# Patient Record
Sex: Female | Born: 1985 | Race: Black or African American | Hispanic: No | Marital: Single | Smoking: Current every day smoker
Health system: Southern US, Community
[De-identification: ages and names within clinical notes are randomized; demographics above are authoritative.]

## PROBLEM LIST (undated history)

## (undated) DIAGNOSIS — I10 Essential (primary) hypertension: Secondary | ICD-10-CM

## (undated) DIAGNOSIS — R569 Unspecified convulsions: Secondary | ICD-10-CM

## (undated) DIAGNOSIS — D496 Neoplasm of unspecified behavior of brain: Secondary | ICD-10-CM

---
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
## 2004-01-04 ENCOUNTER — Emergency Department (HOSPITAL_COMMUNITY): Admission: EM | Admit: 2004-01-04 | Discharge: 2004-01-04 | Payer: Self-pay | Admitting: Emergency Medicine

## 2005-10-28 ENCOUNTER — Emergency Department (HOSPITAL_COMMUNITY): Admission: EM | Admit: 2005-10-28 | Discharge: 2005-10-28 | Payer: Self-pay | Admitting: Emergency Medicine

## 2006-09-09 ENCOUNTER — Emergency Department (HOSPITAL_COMMUNITY): Admission: EM | Admit: 2006-09-09 | Discharge: 2006-09-09 | Payer: Self-pay | Admitting: Emergency Medicine

## 2007-05-27 ENCOUNTER — Emergency Department (HOSPITAL_COMMUNITY): Admission: EM | Admit: 2007-05-27 | Discharge: 2007-05-27 | Payer: Self-pay | Admitting: Emergency Medicine

## 2009-04-06 ENCOUNTER — Emergency Department (HOSPITAL_COMMUNITY): Admission: EM | Admit: 2009-04-06 | Discharge: 2009-04-06 | Payer: Self-pay | Admitting: Emergency Medicine

## 2009-12-21 ENCOUNTER — Emergency Department (HOSPITAL_COMMUNITY): Admission: EM | Admit: 2009-12-21 | Discharge: 2009-12-21 | Payer: Self-pay | Admitting: Emergency Medicine

## 2010-01-02 ENCOUNTER — Emergency Department (HOSPITAL_COMMUNITY): Admission: EM | Admit: 2010-01-02 | Discharge: 2010-01-02 | Payer: Self-pay | Admitting: Emergency Medicine

## 2010-12-31 LAB — LIPASE, BLOOD: Lipase: 19 U/L (ref 11–59)

## 2010-12-31 LAB — COMPREHENSIVE METABOLIC PANEL
ALT: 17 U/L (ref 0–35)
Albumin: 4.3 g/dL (ref 3.5–5.2)
BUN: 9 mg/dL (ref 6–23)
CO2: 20 mEq/L (ref 19–32)
Creatinine, Ser: 0.81 mg/dL (ref 0.4–1.2)
Potassium: 3.7 mEq/L (ref 3.5–5.1)
Total Bilirubin: 0.6 mg/dL (ref 0.3–1.2)
Total Protein: 7.9 g/dL (ref 6.0–8.3)

## 2010-12-31 LAB — URINALYSIS, ROUTINE W REFLEX MICROSCOPIC
Bilirubin Urine: NEGATIVE
Ketones, ur: 15 mg/dL — AB
Specific Gravity, Urine: 1.015 (ref 1.005–1.030)
pH: 6 (ref 5.0–8.0)

## 2010-12-31 LAB — URINE MICROSCOPIC-ADD ON

## 2010-12-31 LAB — DIFFERENTIAL: Neutrophils Relative %: 73 % (ref 43–77)

## 2010-12-31 LAB — CBC
Hemoglobin: 14.9 g/dL (ref 12.0–15.0)
MCHC: 33.2 g/dL (ref 30.0–36.0)
MCV: 83.5 fL (ref 78.0–100.0)
Platelets: 269 10*3/uL (ref 150–400)
RBC: 5.36 MIL/uL — ABNORMAL HIGH (ref 3.87–5.11)
WBC: 7.3 10*3/uL (ref 4.0–10.5)

## 2011-07-05 LAB — COMPREHENSIVE METABOLIC PANEL
BUN: 9
CO2: 24
Creatinine, Ser: 0.62
GFR calc Af Amer: >60
GFR calc non Af Amer: >60
Glucose, Bld: 90
Sodium: 136
Total Bilirubin: 0.6

## 2011-07-05 LAB — CBC
HCT: 37.8
Hemoglobin: 12.8
MCV: 79.9
Platelets: 300
RBC: 4.74
RDW: 13.9

## 2011-07-05 LAB — URINALYSIS, ROUTINE W REFLEX MICROSCOPIC
Bilirubin Urine: NEGATIVE
Glucose, UA: NEGATIVE
Specific Gravity, Urine: 1.02
Urobilinogen, UA: 1

## 2011-07-05 LAB — DIFFERENTIAL
Basophils Relative: 0
Eosinophils Absolute: 0.1
Eosinophils Relative: 1
Lymphocytes Relative: 27
Monocytes Relative: 9
Neutro Abs: 5.6

## 2011-07-05 LAB — URINE MICROSCOPIC-ADD ON

## 2011-07-05 LAB — LIPASE, BLOOD: Lipase: 15

## 2011-07-05 LAB — POCT PREGNANCY, URINE
Operator id: 28741
Preg Test, Ur: NEGATIVE

## 2015-08-08 ENCOUNTER — Emergency Department (HOSPITAL_COMMUNITY)
Admission: EM | Admit: 2015-08-08 | Discharge: 2015-08-08 | Disposition: A | Discharge status: Home / Self Care | Payer: Self-pay | Attending: Emergency Medicine | Admitting: Emergency Medicine

## 2015-08-08 ENCOUNTER — Encounter (HOSPITAL_COMMUNITY): Payer: Self-pay | Admitting: Emergency Medicine

## 2015-08-08 DIAGNOSIS — Z3202 Encounter for pregnancy test, result negative: Secondary | ICD-10-CM | POA: Insufficient documentation

## 2015-08-08 DIAGNOSIS — N939 Abnormal uterine and vaginal bleeding, unspecified: Secondary | ICD-10-CM | POA: Insufficient documentation

## 2015-08-08 DIAGNOSIS — F17200 Nicotine dependence, unspecified, uncomplicated: Secondary | ICD-10-CM | POA: Insufficient documentation

## 2015-08-08 DIAGNOSIS — B9689 Other specified bacterial agents as the cause of diseases classified elsewhere: Secondary | ICD-10-CM

## 2015-08-08 DIAGNOSIS — N760 Acute vaginitis: Secondary | ICD-10-CM | POA: Insufficient documentation

## 2015-08-08 LAB — WET PREP, GENITAL
SPERM: NONE SEEN
TRICH WET PREP: NONE SEEN
YEAST WET PREP: NONE SEEN

## 2015-08-08 LAB — CBC WITH DIFFERENTIAL/PLATELET
BASOS ABS: 0 10*3/uL (ref 0.0–0.1)
Basophils Relative: 0 %
EOS PCT: 2 %
Eosinophils Absolute: 0.2 10*3/uL (ref 0.0–0.7)
HCT: 39.8 % (ref 36.0–46.0)
HEMOGLOBIN: 13.4 g/dL (ref 12.0–15.0)
LYMPHS ABS: 3.6 10*3/uL (ref 0.7–4.0)
LYMPHS PCT: 42 %
MCH: 27.5 pg (ref 26.0–34.0)
MCHC: 33.7 g/dL (ref 30.0–36.0)
MCV: 81.7 fL (ref 78.0–100.0)
Monocytes Absolute: 0.8 10*3/uL (ref 0.1–1.0)
Monocytes Relative: 9 %
NEUTROS ABS: 4.1 10*3/uL (ref 1.7–7.7)
Neutrophils Relative %: 47 %
PLATELETS: 253 10*3/uL (ref 150–400)
RBC: 4.87 MIL/uL (ref 3.87–5.11)
RDW: 14.3 % (ref 11.5–15.5)
WBC: 8.7 10*3/uL (ref 4.0–10.5)

## 2015-08-08 LAB — I-STAT BETA HCG BLOOD, ED (MC, WL, AP ONLY): I-stat hCG, quantitative: <5 m[IU]/mL (ref <5)

## 2015-08-08 MED ORDER — NAPROXEN 500 MG PO TABS: 500.0000 mg | ORAL_TABLET | Freq: Two times a day (BID) | ORAL | Status: DC

## 2015-08-08 MED ORDER — LIDOCAINE HCL (PF) 1 % IJ SOLN
INTRAMUSCULAR | Status: AC
  Administered 2015-08-08: 0.9 mL
  Filled 2015-08-08: qty 5

## 2015-08-08 MED ORDER — CEFTRIAXONE SODIUM 250 MG IJ SOLR
250.0000 mg | Freq: Once | INTRAMUSCULAR | Status: AC
  Administered 2015-08-08: 250 mg via INTRAMUSCULAR
  Filled 2015-08-08: qty 250

## 2015-08-08 MED ORDER — METRONIDAZOLE 500 MG PO TABS: 500.0000 mg | ORAL_TABLET | Freq: Two times a day (BID) | ORAL | Status: DC

## 2015-08-08 MED ORDER — AZITHROMYCIN 250 MG PO TABS
1000.0000 mg | ORAL_TABLET | Freq: Once | ORAL | Status: AC
  Administered 2015-08-08: 1000 mg via ORAL
  Filled 2015-08-08: qty 4

## 2015-08-08 NOTE — ED Notes (Signed)
Pt reports vaginal bleeding off and on since October 15th. Pt reports the bleeding comes and goes. Blood is a mixture of bright and dark and she reports she did have some clots. Pt reports she had one instance like this when she was 23 where she bled for 2 months and they diagnosed her with abnormal cells.

## 2015-08-08 NOTE — Discharge Instructions (Signed)
Your labs reveal bacterial vaginosis, take flagyl as directed and do not drink alcohol while taking this medication. Take naprosyn as directed to help stop your vaginal bleeding. You have been treated for gonorrhea and chlamydia in the ER but the hospital will call you if lab is positive. You were tested for HIV and Syphilis, and the hospital will call you if the lab is positive. Follow up with [facility] in 1-2 weeks to establish medical care, and follow up with women's clinic in 1-2 weeks to have ongoing management of your irregular menstrual cycles and abnormal uterine bleeding. Return to the ER for changes or worsening symptoms.   Abnormal Uterine Bleeding Abnormal uterine bleeding can affect women at various stages in life, including teenagers, women in their reproductive years, pregnant women, and women who have reached menopause. Several kinds of uterine bleeding are considered abnormal, including:  Bleeding or spotting between periods.   Bleeding after sexual intercourse.   Bleeding that is heavier or more than normal.   Periods that last longer than usual.  Bleeding after menopause.  Many cases of abnormal uterine bleeding are minor and simple to treat, while others are more serious. Any type of abnormal bleeding should be evaluated by your health care provider. Treatment will depend on the cause of the bleeding. HOME CARE INSTRUCTIONS Monitor your condition for any changes. The following actions may help to alleviate any discomfort you are experiencing:  Avoid the use of tampons and douches as directed by your health care provider.  Change your pads frequently. You should get regular pelvic exams and Pap tests. Keep all follow-up appointments for diagnostic tests as directed by your health care provider.  SEEK MEDICAL CARE IF:   Your bleeding lasts more than 1 week.   You feel dizzy at times.  SEEK IMMEDIATE MEDICAL CARE IF:   You pass out.   You are  changing pads every 15 to 30 minutes.   You have abdominal pain.  You have a fever.   You become sweaty or weak.   You are passing large blood clots from the vagina.   You start to feel nauseous and vomit. MAKE SURE YOU:   Understand these instructions.  Will watch your condition.  Will get help right away if you are not doing well or get worse.   This information is not intended to replace advice given to you by your health care provider. Make sure you discuss any questions you have with your health care provider.   Document Released: 09/09/2005 Document Revised: 09/14/2013 Document Reviewed: 04/08/2013 Elsevier Interactive Patient Education 2016 Elsevier Inc.  Bacterial Vaginosis Bacterial vaginosis is an infection of the vagina. It happens when too many germs (bacteria) grow in the vagina. Having this infection puts you at risk for getting other infections from sex. Treating this infection can help lower your risk for other infections, such as:   Chlamydia.  Gonorrhea.  HIV.  Herpes. HOME CARE  Take your medicine as told by your doctor.  Finish your medicine even if you start to feel better.  Tell your sex partner that you have an infection. They should see their doctor for treatment.  During treatment:  Avoid sex or use condoms correctly.  Do not douche.  Do not drink alcohol unless your doctor tells you it is ok.  Do not breastfeed unless your doctor tells you it is ok. GET HELP IF:  You are not getting better after 3 days of treatment.  You  have more grey fluid (discharge) coming from your vagina than before.  You have more pain than before.  You have a fever. MAKE SURE YOU:   Understand these instructions.  Will watch your condition.  Will get help right away if you are not doing well or get worse.   This information is not intended to replace advice given to you by your health care provider. Make sure you discuss any questions you have  with your health care provider.   Document Released: 06/18/2008 Document Revised: 09/30/2014 Document Reviewed: 04/21/2013 Elsevier Interactive Patient Education [employer].

## 2015-08-08 NOTE — ED Provider Notes (Signed)
CSN: [id number]     Arrival date & time 08/08/15  1845 History   First MD Initiated Contact with Patient 08/08/15 1913     Chief Complaint  Patient presents with  . Vaginal Bleeding     (Consider location/radiation/quality/duration/timing/severity/associated sxs/prior Treatment) HPI Comments: Shelia Rasmussen is a 29 y.o. female with a PMHx of abnormal cervical cells s/p conization, and irregular menses, who presents to the ED with complaints of vaginal bleeding 1 month. Patient reports that initially the bleeding was bright red but has become slightly darker, she had one clot past 2 weeks ago but none since then, she is using 4-5 pads per day but they do not become saturated stating that she changes them immediately after they had blood in them. She states that the bleeding is less than a typical menstrual cycle. No known aggravating factors, she has not tried anything for her symptoms. She has a history of irregular menses, LMP on 9/16 and then again on 10/15 which has continued until today. She is sexually active with one female partner, unprotected. She has no primary care doctor at this time.  She denies any fevers, chills, chest pain, shortness breath, abdominal pain, n/v/d/c, melena, hematochezia, dysuria, hematuria, vaginal discharge, genital lesions, numbness, tingling, weakness, lightheadedness, or syncope.  Patient is a 29 y.o. female presenting with vaginal bleeding. The history is provided by the patient. No language interpreter was used.  Vaginal Bleeding Quality:  Bright red, dark red, lighter than menses and clots Severity:  Mild Onset quality:  Gradual Duration:  1 month Timing:  Constant Progression:  Waxing and waning Chronicity:  Recurrent Menstrual history:  Irregular Number of pads used:  4-5 daily but none saturated through, changes them as soon as there is blood on them Context: spontaneously   Relieved by:  None tried Worsened by:  Nothing tried Ineffective  treatments:  None tried Associated symptoms: no abdominal pain, no dizziness, no dysuria, no fever, no nausea and no vaginal discharge   Risk factors: unprotected sex   Risk factors: no new sexual partner     History reviewed. No pertinent past medical history. History reviewed. No pertinent past surgical history. History reviewed. No pertinent family history. Social History  Substance Use Topics  . Smoking status: Current Every Day Smoker -- 0.50 packs/day  . Smokeless tobacco: None  . Alcohol Use: No   OB History    No data available     Review of Systems  Constitutional: Negative for fever and chills.  Respiratory: Negative for shortness of breath.   Cardiovascular: Negative for chest pain.  Gastrointestinal: Negative for nausea, vomiting, abdominal pain, diarrhea, constipation and blood in stool.  Genitourinary: Positive for vaginal bleeding. Negative for dysuria, hematuria, vaginal discharge and vaginal pain.  Musculoskeletal: Negative for myalgias and arthralgias.  Skin: Negative for color change.  Allergic/Immunologic: Negative for immunocompromised state.  Neurological: Negative for dizziness, syncope, weakness, light-headedness and numbness.  Psychiatric/Behavioral: Negative for confusion.   10 Systems reviewed and are negative for acute change except as noted in the HPI.    Allergies  Review of patient's allergies indicates no known allergies.  Home Medications   Prior to Admission medications   Not on File   BP 150/93 mmHg  Pulse 81  Temp(Src) 98.4 F (36.9 C) (Oral)  Resp 18  SpO2 100%  LMP 08/08/2015 Physical Exam  Constitutional: She is oriented to person, place, and time. Vital signs are normal. She appears well-developed and well-nourished.  Non-toxic appearance. No  distress.  Afebrile, nontoxic, NAD  HENT:  Head: Normocephalic and atraumatic.  Mouth/Throat: Oropharynx is clear and moist and mucous membranes are normal.  Eyes: Conjunctivae and  EOM are normal. Right eye exhibits no discharge. Left eye exhibits no discharge.  Neck: Normal range of motion. Neck supple.  Cardiovascular: Normal rate, regular rhythm, normal heart sounds and intact distal pulses.  Exam reveals no gallop and no friction rub.   No murmur heard. Pulmonary/Chest: Effort normal and breath sounds normal. No respiratory distress. She has no decreased breath sounds. She has no wheezes. She has no rhonchi. She has no rales.  Abdominal: Soft. Normal appearance and bowel sounds are normal. She exhibits no distension. There is no tenderness. There is no rigidity, no rebound, no guarding, no CVA tenderness, no tenderness at McBurney's point and negative Murphy's sign.  Genitourinary: Uterus normal. Pelvic exam was performed with patient supine. There is no rash, tenderness or lesion on the right labia. There is no rash, tenderness or lesion on the left labia. Cervix exhibits discharge and friability. Cervix exhibits no motion tenderness. Right adnexum displays no mass, no tenderness and no fullness. Left adnexum displays no mass, no tenderness and no fullness. No erythema, tenderness or bleeding in the vagina. No signs of injury around the vagina. No vaginal discharge found.  Chaperone present for exam. No rashes, lesions, or tenderness to external genitalia. No erythema, injury, or tenderness to vaginal mucosa. No vaginal discharge or bleeding within vaginal vault. No old blood in vaginal vault. No adnexal masses, tenderness, or fullness. No CMT, some mild cervical friability and mucoid discharge coming from cervical os. Uterus non-deviated, mobile, nonTTP, and without enlargement.    Musculoskeletal: Normal range of motion.  Neurological: She is alert and oriented to person, place, and time. She has normal strength. No sensory deficit.  Skin: Skin is warm, dry and intact. No rash noted.  Psychiatric: She has a normal mood and affect.  Nursing note and vitals reviewed.   ED  Course  Procedures (including critical care time) Labs Review Labs Reviewed  WET PREP, GENITAL - Abnormal; Notable for the following:    Clue Cells Wet Prep HPF POC PRESENT (*)    WBC, Wet Prep HPF POC MANY (*)    All other components within normal limits  CBC WITH DIFFERENTIAL/PLATELET  RPR  HIV ANTIBODY (ROUTINE TESTING)  I-STAT BETA HCG BLOOD, ED ([facility], [facility], [facility] ONLY)  GC/CHLAMYDIA PROBE AMP () NOT AT [facility]    Imaging Review No results found. I have personally reviewed and evaluated these images and lab results as part of my medical decision-making.   EKG Interpretation None      MDM   Final diagnoses:  Abnormal uterine and vaginal bleeding, unspecified  BV (bacterial vaginosis)    29 y.o. female here with ongoing vaginal bleeding x1 month. Has had a hx of this in the past. On pelvic exam, some cervical discharge and friability but no bleeding noted, no injury to cervix of vaginal mucosa. No blood in vaginal vault. No CMT or tenderness. Doubt need for urgent u/s, pt may benefit from outpt u/s to eval for possible fibroids/etc. Will get CBC, HCG, STD testing, and wet prep now. Will empirically treat for GC/CT given the cervical d/c on exam. Will reassess after labs.   8:49 PM CBC unremarkable, no anemia. BetaHCG neg. Wet prep with +clue cells, many WBCs. Will treat for BV, already received empiric GC/CT treatment, doubt need for PID treatment given no tenderness  on pelvic exam. Will refer to women's clinic and [facility] to establish care and for ongoing management of irregular menses. Will start on naprosyn BID x5 days to help stop bleeding. I explained the diagnosis and have given explicit precautions to return to the ER including for any other new or worsening symptoms. The patient understands and accepts the medical plan as it's been dictated and I have answered their questions. Discharge instructions concerning home care and prescriptions have been given. The patient is  STABLE and is discharged to home in good condition.  BP 144/91 mmHg  Pulse 77  Temp(Src) 98.2 F (36.8 C) (Oral)  Resp 16  SpO2 100%  LMP 08/08/2015  Meds ordered this encounter  Medications  . azithromycin (ZITHROMAX) tablet 1,000 mg    Sig:    And  . cefTRIAXone (ROCEPHIN) injection 250 mg    Sig:     Order Specific Question:  Antibiotic Indication:    Answer:  STD  . lidocaine (PF) (XYLOCAINE) 1 % injection    Sig:     Ferrainolo, Jessica : cabinet override  . metroNIDAZOLE (FLAGYL) 500 MG tablet    Sig: Take 1 tablet (500 mg total) by mouth 2 (two) times daily. One po bid x 7 days    Dispense:  14 tablet    Refill:  0    Order Specific Question:  Supervising Provider    Answer:  Sabra Heck, BRIAN [[id number]]  . naproxen (NAPROSYN) 500 MG tablet    Sig: Take 1 tablet (500 mg total) by mouth 2 (two) times daily with a meal. x5-7 days    Dispense:  14 tablet    Refill:  0    Order Specific Question:  Supervising Provider    Answer:  Jenny Reichmann Camprubi-Soms, PA-C 08/08/15 2055  Nat Christen, MD 08/11/15 [phone]

## 2015-08-08 NOTE — ED Notes (Signed)
Phlebotomy at bedside.

## 2015-08-09 LAB — RPR: RPR Ser Ql: NONREACTIVE

## 2015-08-09 LAB — HIV ANTIBODY (ROUTINE TESTING W REFLEX): HIV SCREEN 4TH GENERATION: NONREACTIVE

## 2015-08-09 LAB — GC/CHLAMYDIA PROBE AMP (~~LOC~~) NOT AT ARMC
CHLAMYDIA, DNA PROBE: NEGATIVE
NEISSERIA GONORRHEA: NEGATIVE

## 2015-11-06 ENCOUNTER — Emergency Department (HOSPITAL_COMMUNITY)
Admission: EM | Admit: 2015-11-06 | Discharge: 2015-11-06 | Disposition: A | Discharge status: Home / Self Care | Payer: Self-pay | Attending: Emergency Medicine | Admitting: Emergency Medicine

## 2015-11-06 ENCOUNTER — Encounter (HOSPITAL_COMMUNITY): Payer: Self-pay | Admitting: *Deleted

## 2015-11-06 DIAGNOSIS — H6123 Impacted cerumen, bilateral: Secondary | ICD-10-CM | POA: Insufficient documentation

## 2015-11-06 DIAGNOSIS — F17200 Nicotine dependence, unspecified, uncomplicated: Secondary | ICD-10-CM | POA: Insufficient documentation

## 2015-11-06 MED ORDER — DOCUSATE SODIUM 100 MG PO CAPS
200.0000 mg | ORAL_CAPSULE | Freq: Once | ORAL | Status: AC
  Administered 2015-11-06: 200 mg via ORAL
  Filled 2015-11-06: qty 2

## 2015-11-06 NOTE — ED Notes (Signed)
Colace applied to bilateral ears

## 2015-11-06 NOTE — ED Provider Notes (Signed)
CSN: [id number]     Arrival date & time 11/06/15  1957 History  By signing my name below, I, Starleen Arms, attest that this documentation has been prepared under the direction and in the presence of Etta Quill, NP. Electronically Signed: Starleen Arms ED Scribe. 11/06/2015. 9:06 PM.    Chief Complaint  Patient presents with  . Otalgia   HPI HPI Comments: Shelia Rasmussen is a 30 y.o. female who presents to the Emergency Department complaining of constant, moderate right ear pain and decreased hearing onset two days ago.  She has used OTC ear drops last night but was unable to remove and cerumen.  She dneies sore throat, fever, chills, nasal congestion, URI symptoms.    History reviewed. No pertinent past medical history. History reviewed. No pertinent past surgical history. No family history on file. Social History  Substance Use Topics  . Smoking status: Current Every Day Smoker -- 0.50 packs/day  . Smokeless tobacco: None  . Alcohol Use: No   OB History    No data available     Review of Systems  Constitutional: Negative for fever and chills.  HENT: Positive for ear pain and hearing loss. Negative for tinnitus.   All other systems reviewed and are negative.  A complete 10 system review of systems was obtained and all systems are negative except as noted in the HPI and PMH.   Allergies  Review of patient's allergies indicates no known allergies.  Home Medications   Prior to Admission medications   Medication Sig Start Date End Date Taking? Authorizing Provider  metroNIDAZOLE (FLAGYL) 500 MG tablet Take 1 tablet (500 mg total) by mouth 2 (two) times daily. One po bid x 7 days 08/08/15   Mercedes Camprubi-Soms, PA-C  naproxen (NAPROSYN) 500 MG tablet Take 1 tablet (500 mg total) by mouth 2 (two) times daily with a meal. x5-7 days 08/08/15   Mercedes Camprubi-Soms, PA-C   BP 139/82 mmHg  Pulse 76  Temp(Src) 98.4 F (36.9 C) (Oral)  Resp 16  SpO2 100%  LMP  11/05/2015 Physical Exam  Constitutional: She is oriented to person, place, and time. She appears well-developed and well-nourished. No distress.  HENT:  Head: Normocephalic and atraumatic.  Bilateral cerumen impactions.   Eyes: Conjunctivae and EOM are normal.  Neck: Neck supple. No tracheal deviation present.  Cardiovascular: Normal rate.   Pulmonary/Chest: Effort normal. No respiratory distress.  Musculoskeletal: Normal range of motion.  Neurological: She is alert and oriented to person, place, and time.  Skin: Skin is warm and dry.  Psychiatric: She has a normal mood and affect. Her behavior is normal.  Nursing note and vitals reviewed.   ED Course  Procedures (including critical care time)  DIAGNOSTIC STUDIES: Oxygen Saturation is 100% on RA, normal by my interpretation.    COORDINATION OF CARE:  9:07 PM Discussed plans to perform cerumen impaction removal.  Patient acknowledges and agrees with plan.    Labs Review Labs Reviewed - No data to display  Imaging Review No results found. I have personally reviewed and evaluated these images and lab results as part of my medical decision-making.   EKG Interpretation None      MDM   Final diagnoses:  None    Bilateral cerumen impaction.  Ear irrigation performed in ED with good results. Ability to hear improved.  Normal TM's.  Care instructions provided.  I personally performed the services described in this documentation, which was scribed in my presence. The recorded information  has been reviewed and is accurate.    Etta Quill, NP 11/06/15 2257  Ezequiel Essex, MD 11/07/15 [phone]

## 2015-11-06 NOTE — Discharge Instructions (Signed)
Cerumen Impaction The structures of the external ear canal secrete a waxy substance known as cerumen. Excess cerumen can build up in the ear canal, causing a condition known as cerumen impaction. Cerumen impaction can cause ear pain and disrupt the function of the ear. The rate of cerumen production differs for each individual. In certain individuals, the configuration of the ear canal may decrease his or her ability to naturally remove cerumen. CAUSES Cerumen impaction is caused by excessive cerumen production or buildup. RISK FACTORS  Frequent use of swabs to clean ears.  Having narrow ear canals.  Having eczema.  Being dehydrated. SIGNS AND SYMPTOMS  Diminished hearing.  Ear drainage.  Ear pain.  Ear itch. TREATMENT Treatment may involve:  Over-the-counter or prescription ear drops to soften the cerumen.  Removal of cerumen by a health care provider. This may be done with:  Irrigation with warm water. This is the most common method of removal.  Ear curettes and other instruments.  Surgery. This may be done in severe cases. HOME CARE INSTRUCTIONS  Take medicines only as directed by your health care provider.  Do not insert objects into the ear with the intent of cleaning the ear. PREVENTION  Do not insert objects into the ear, even with the intent of cleaning the ear. Removing cerumen as a part of normal hygiene is not necessary, and the use of swabs in the ear canal is not recommended.  Drink enough water to keep your urine clear or pale yellow.  Control your eczema if you have it. SEEK MEDICAL CARE IF:  You develop ear pain.  You develop bleeding from the ear.  The cerumen does not clear after you use ear drops as directed.   This information is not intended to replace advice given to you by your health care provider. Make sure you discuss any questions you have with your health care provider.   Document Released: 10/17/2004 Document Revised: 09/30/2014  Document Reviewed: 04/26/2015 Elsevier Interactive Patient Education 2016 Elsevier Inc.  

## 2015-11-06 NOTE — ED Notes (Signed)
Pt to ED c/o earache x 2 days, denies nasal congestion, reports frequent use of headsets at work.

## 2016-05-11 ENCOUNTER — Encounter (HOSPITAL_COMMUNITY): Payer: Self-pay | Admitting: Emergency Medicine

## 2016-05-11 ENCOUNTER — Emergency Department (HOSPITAL_COMMUNITY)
Admission: EM | Admit: 2016-05-11 | Discharge: 2016-05-11 | Disposition: A | Discharge status: Home / Self Care | Payer: Self-pay | Attending: Emergency Medicine | Admitting: Emergency Medicine

## 2016-05-11 DIAGNOSIS — T22211A Burn of second degree of right forearm, initial encounter: Secondary | ICD-10-CM | POA: Insufficient documentation

## 2016-05-11 DIAGNOSIS — IMO0002 Reserved for concepts with insufficient information to code with codable children: Secondary | ICD-10-CM

## 2016-05-11 DIAGNOSIS — Y929 Unspecified place or not applicable: Secondary | ICD-10-CM | POA: Insufficient documentation

## 2016-05-11 DIAGNOSIS — I10 Essential (primary) hypertension: Secondary | ICD-10-CM | POA: Insufficient documentation

## 2016-05-11 DIAGNOSIS — Y990 Civilian activity done for income or pay: Secondary | ICD-10-CM | POA: Insufficient documentation

## 2016-05-11 DIAGNOSIS — F17200 Nicotine dependence, unspecified, uncomplicated: Secondary | ICD-10-CM | POA: Insufficient documentation

## 2016-05-11 DIAGNOSIS — Y939 Activity, unspecified: Secondary | ICD-10-CM | POA: Insufficient documentation

## 2016-05-11 DIAGNOSIS — X118XXA Contact with other hot tap-water, initial encounter: Secondary | ICD-10-CM | POA: Insufficient documentation

## 2016-05-11 HISTORY — DX: Essential (primary) hypertension: I10

## 2016-05-11 MED ORDER — KETOROLAC TROMETHAMINE 60 MG/2ML IM SOLN
60.0000 mg | Freq: Once | INTRAMUSCULAR | Status: DC
  Filled 2016-05-11: qty 2

## 2016-05-11 MED ORDER — SILVER SULFADIAZINE 1 % EX CREA: 1.0000 "application " | TOPICAL_CREAM | Freq: Two times a day (BID) | CUTANEOUS | 0 refills | Status: DC

## 2016-05-11 MED ORDER — SILVER SULFADIAZINE 1 % EX CREA
TOPICAL_CREAM | Freq: Once | CUTANEOUS | Status: AC
  Administered 2016-05-11: 14:00:00 via TOPICAL
  Filled 2016-05-11: qty 85

## 2016-05-11 MED ORDER — TRAMADOL HCL 50 MG PO TABS: 50.0000 mg | ORAL_TABLET | Freq: Four times a day (QID) | ORAL | 0 refills | Status: DC | PRN

## 2016-05-11 MED ORDER — IBUPROFEN 400 MG PO TABS
600.0000 mg | ORAL_TABLET | Freq: Once | ORAL | Status: AC
  Administered 2016-05-11: 600 mg via ORAL
  Filled 2016-05-11: qty 1

## 2016-05-11 NOTE — ED Triage Notes (Signed)
Pt has been self treating a burn to right arm 3 weeks. Pt reports happened at work where she got burned with 196 degree water. Pt has been using antibiotic cream and ointment to self treat without relief.

## 2016-05-11 NOTE — ED Provider Notes (Signed)
[facility] DEPT Provider Note   CSN: [id number] Arrival date & time: 05/11/16  1127     History   Chief Complaint Chief Complaint  Patient presents with  . Burn    HPI Shelia Rasmussen is a 30 y.o. female.  HPI Patient states she burned her right forearm on 7/27 after dropping hot water on it at work. She initially had 2 large blisters that developed. She is a burst and she's been placing antibiotic ointment on the wound since. She's had some mild swelling to the arm. No redness, warmth, fever or chills. States she's been taking ibuprofen which is not controlling her pain. Past Medical History:  Diagnosis Date  . Hypertension     There are no active problems to display for this patient.   History reviewed. No pertinent surgical history.  OB History    No data available       Home Medications    Prior to Admission medications   Medication Sig Start Date End Date Taking? Authorizing Provider  metroNIDAZOLE (FLAGYL) 500 MG tablet Take 1 tablet (500 mg total) by mouth 2 (two) times daily. One po bid x 7 days 08/08/15   Mercedes Camprubi-Soms, PA-C  naproxen (NAPROSYN) 500 MG tablet Take 1 tablet (500 mg total) by mouth 2 (two) times daily with a meal. x5-7 days 08/08/15   Mercedes Camprubi-Soms, PA-C  silver sulfADIAZINE (SILVADENE) 1 % cream Apply 1 application topically 2 (two) times daily. 05/11/16   Julianne Rice, MD  traMADol (ULTRAM) 50 MG tablet Take 1 tablet (50 mg total) by mouth every 6 (six) hours as needed for severe pain. 05/11/16   Julianne Rice, MD    Family History No family history on file.  Social History Social History  Substance Use Topics  . Smoking status: Current Every Day Smoker    Packs/day: 0.50  . Smokeless tobacco: Never Used  . Alcohol use No     Allergies   Review of patient's allergies indicates no known allergies.   Review of Systems Review of Systems  Constitutional: Negative for chills and fever.  Musculoskeletal:  Negative for myalgias.  Skin: Positive for wound.  Neurological: Negative for weakness and numbness.  All other systems reviewed and are negative.    Physical Exam Updated Vital Signs BP 145/89 (BP Location: Left Arm)   Pulse 90   Temp 98.1 F (36.7 C) (Oral)   Resp 20   Wt 222 lb 3 oz (100.8 kg)   LMP 05/09/2016   SpO2 100%   Physical Exam  Constitutional: She is oriented to person, place, and time. She appears well-developed and well-nourished. No distress.  HENT:  Head: Normocephalic and atraumatic.  Eyes: Pupils are equal, round, and reactive to light.  Neck: Normal range of motion. Neck supple.  Cardiovascular: Normal rate.   Pulmonary/Chest: Effort normal.  Abdominal: Soft.  Musculoskeletal: Normal range of motion. She exhibits edema and tenderness. She exhibits no deformity.  Neurological: She is alert and oriented to person, place, and time.  5/5 motor in all extremities. Sensation intact.  Skin: Skin is warm and dry. No rash noted. She is not diaphoretic. No erythema.  Patient with roughly 1% total body surface area burn to the dorsum of the right forearm. Tender to palpation over the site. Evidence of previously opened bulla. There is no obvious erythema or warmth. Patient does have some mild swelling to the right forearm compared to the left. 2+ radial pulses bilaterally. Good distal cap refill.  Psychiatric:  She has a normal mood and affect. Her behavior is normal.  Nursing note and vitals reviewed.    ED Treatments / Results  Labs (all labs ordered are listed, but only abnormal results are displayed) Labs Reviewed - No data to display  EKG  EKG Interpretation None       Radiology No results found.  Procedures Procedures (including critical care time)  Medications Ordered in ED Medications  silver sulfADIAZINE (SILVADENE) 1 % cream (not administered)  ketorolac (TORADOL) injection 60 mg (not administered)     Initial Impression / Assessment  and Plan / ED Course  I have reviewed the triage vital signs and the nursing notes.  Pertinent labs & imaging results that were available during my care of the patient were reviewed by me and considered in my medical decision making (see chart for details).  Clinical Course   Silvadene placed on wound. Given f/u with burn center. Advised to keep arm elevated for swelling. Low suspicion of DVT. Return precautions given   Final Clinical Impressions(s) / ED Diagnoses   Final diagnoses:  Second degree burn    New Prescriptions New Prescriptions   SILVER SULFADIAZINE (SILVADENE) 1 % CREAM    Apply 1 application topically 2 (two) times daily.   TRAMADOL (ULTRAM) 50 MG TABLET    Take 1 tablet (50 mg total) by mouth every 6 (six) hours as needed for severe pain.     Julianne Rice, MD 05/11/16 1353

## 2017-09-24 ENCOUNTER — Encounter (HOSPITAL_COMMUNITY): Payer: Self-pay | Admitting: Emergency Medicine

## 2017-09-24 ENCOUNTER — Emergency Department (HOSPITAL_COMMUNITY)
Admission: EM | Admit: 2017-09-24 | Discharge: 2017-09-24 | Disposition: A | Discharge status: Home / Self Care | Payer: Self-pay | Attending: Emergency Medicine | Admitting: Emergency Medicine

## 2017-09-24 ENCOUNTER — Ambulatory Visit (HOSPITAL_COMMUNITY)
Admission: EM | Admit: 2017-09-24 | Discharge: 2017-09-24 | Disposition: A | Discharge status: Home / Self Care | Payer: Self-pay

## 2017-09-24 DIAGNOSIS — Z5321 Procedure and treatment not carried out due to patient leaving prior to being seen by health care provider: Secondary | ICD-10-CM | POA: Insufficient documentation

## 2017-09-24 DIAGNOSIS — M79641 Pain in right hand: Secondary | ICD-10-CM | POA: Insufficient documentation

## 2017-09-24 LAB — CBC WITH DIFFERENTIAL/PLATELET
BASOS PCT: 0 %
Basophils Absolute: 0 10*3/uL (ref 0.0–0.1)
EOS ABS: 0.1 10*3/uL (ref 0.0–0.7)
Eosinophils Relative: 2 %
HEMATOCRIT: 39 % (ref 36.0–46.0)
HEMOGLOBIN: 12.5 g/dL (ref 12.0–15.0)
Lymphocytes Relative: 32 %
Lymphs Abs: 2 10*3/uL (ref 0.7–4.0)
MCH: 26 pg (ref 26.0–34.0)
MCHC: 32.1 g/dL (ref 30.0–36.0)
MCV: 81.1 fL (ref 78.0–100.0)
Monocytes Absolute: 0.3 10*3/uL (ref 0.1–1.0)
Monocytes Relative: 5 %
NEUTROS ABS: 3.9 10*3/uL (ref 1.7–7.7)
NEUTROS PCT: 61 %
Platelets: 252 10*3/uL (ref 150–400)
RBC: 4.81 MIL/uL (ref 3.87–5.11)
RDW: 14.3 % (ref 11.5–15.5)
WBC: 6.3 10*3/uL (ref 4.0–10.5)

## 2017-09-24 LAB — COMPREHENSIVE METABOLIC PANEL
ALT: 12 U/L — AB (ref 14–54)
ANION GAP: 6 (ref 5–15)
AST: 18 U/L (ref 15–41)
Albumin: 3.9 g/dL (ref 3.5–5.0)
Alkaline Phosphatase: 32 U/L — ABNORMAL LOW (ref 38–126)
BUN: 10 mg/dL (ref 6–20)
CALCIUM: 9 mg/dL (ref 8.9–10.3)
CHLORIDE: 108 mmol/L (ref 101–111)
CO2: 23 mmol/L (ref 22–32)
CREATININE: 0.73 mg/dL (ref 0.44–1.00)
Glucose, Bld: 99 mg/dL (ref 65–99)
Potassium: 4.1 mmol/L (ref 3.5–5.1)
SODIUM: 137 mmol/L (ref 135–145)
Total Bilirubin: 0.7 mg/dL (ref 0.3–1.2)
Total Protein: 7.3 g/dL (ref 6.5–8.1)

## 2017-09-24 LAB — I-STAT CG4 LACTIC ACID, ED: LACTIC ACID, VENOUS: 1.08 mmol/L (ref 0.5–1.9)

## 2017-09-24 NOTE — ED Notes (Signed)
No answer

## 2017-09-24 NOTE — ED Triage Notes (Signed)
Pt reports getting her nails done last Friday, noticed pain and swelling a few days ago around right thumbnail, reports it is now draining yellow pus. Swelling noted to finger.

## 2017-09-24 NOTE — ED Notes (Signed)
Ed called-patient went to the ed

## 2017-09-24 NOTE — ED Notes (Signed)
Pt called in lobby for vitals update. No response.

## 2017-09-24 NOTE — ED Notes (Signed)
CALLED X 3 in lobby, NO answer

## 2021-01-05 ENCOUNTER — Other Ambulatory Visit: Payer: Self-pay

## 2021-01-05 ENCOUNTER — Emergency Department (HOSPITAL_COMMUNITY): Payer: Self-pay

## 2021-01-05 ENCOUNTER — Emergency Department (HOSPITAL_COMMUNITY)
Admission: EM | Admit: 2021-01-05 | Discharge: 2021-01-05 | Disposition: A | Discharge status: Home / Self Care | Payer: Self-pay | Attending: Emergency Medicine | Admitting: Emergency Medicine

## 2021-01-05 ENCOUNTER — Encounter (HOSPITAL_COMMUNITY): Payer: Self-pay | Admitting: *Deleted

## 2021-01-05 DIAGNOSIS — F17210 Nicotine dependence, cigarettes, uncomplicated: Secondary | ICD-10-CM | POA: Insufficient documentation

## 2021-01-05 DIAGNOSIS — I10 Essential (primary) hypertension: Secondary | ICD-10-CM | POA: Insufficient documentation

## 2021-01-05 DIAGNOSIS — R569 Unspecified convulsions: Secondary | ICD-10-CM | POA: Insufficient documentation

## 2021-01-05 DIAGNOSIS — R220 Localized swelling, mass and lump, head: Secondary | ICD-10-CM | POA: Insufficient documentation

## 2021-01-05 DIAGNOSIS — G9389 Other specified disorders of brain: Secondary | ICD-10-CM

## 2021-01-05 LAB — CBC WITH DIFFERENTIAL/PLATELET
Abs Immature Granulocytes: 0.02 10*3/uL (ref 0.00–0.07)
Basophils Absolute: 0 10*3/uL (ref 0.0–0.1)
Basophils Relative: 0 %
Eosinophils Absolute: 0 10*3/uL (ref 0.0–0.5)
Eosinophils Relative: 0 %
HCT: 41.7 % (ref 36.0–46.0)
Hemoglobin: 13.6 g/dL (ref 12.0–15.0)
Immature Granulocytes: 0 %
Lymphocytes Relative: 9 %
Lymphs Abs: 0.6 10*3/uL — ABNORMAL LOW (ref 0.7–4.0)
MCH: 27.6 pg (ref 26.0–34.0)
MCHC: 32.6 g/dL (ref 30.0–36.0)
MCV: 84.8 fL (ref 80.0–100.0)
Monocytes Absolute: 0.6 10*3/uL (ref 0.1–1.0)
Monocytes Relative: 9 %
Neutro Abs: 5.4 10*3/uL (ref 1.7–7.7)
Neutrophils Relative %: 82 %
Platelets: 263 10*3/uL (ref 150–400)
RBC: 4.92 MIL/uL (ref 3.87–5.11)
RDW: 14.2 % (ref 11.5–15.5)
WBC: 6.6 10*3/uL (ref 4.0–10.5)
nRBC: 0 % (ref 0.0–0.2)

## 2021-01-05 LAB — COMPREHENSIVE METABOLIC PANEL
ALT: 17 U/L (ref 0–44)
AST: 23 U/L (ref 15–41)
Albumin: 3.8 g/dL (ref 3.5–5.0)
Alkaline Phosphatase: 33 U/L — ABNORMAL LOW (ref 38–126)
Anion gap: 8 (ref 5–15)
BUN: 11 mg/dL (ref 6–20)
CO2: 21 mmol/L — ABNORMAL LOW (ref 22–32)
Calcium: 8.7 mg/dL — ABNORMAL LOW (ref 8.9–10.3)
Chloride: 106 mmol/L (ref 98–111)
Creatinine, Ser: 0.82 mg/dL (ref 0.44–1.00)
GFR, Estimated: >60 mL/min (ref >60)
Glucose, Bld: 115 mg/dL — ABNORMAL HIGH (ref 70–99)
Potassium: 3.9 mmol/L (ref 3.5–5.1)
Sodium: 135 mmol/L (ref 135–145)
Total Bilirubin: 0.9 mg/dL (ref 0.3–1.2)
Total Protein: 6.6 g/dL (ref 6.5–8.1)

## 2021-01-05 LAB — ETHANOL: Alcohol, Ethyl (B): <10 mg/dL (ref <10)

## 2021-01-05 LAB — I-STAT BETA HCG BLOOD, ED (MC, WL, AP ONLY): I-stat hCG, quantitative: <5 m[IU]/mL (ref <5)

## 2021-01-05 MED ORDER — LEVETIRACETAM IN NACL 1000 MG/100ML IV SOLN
1000.0000 mg | Freq: Once | INTRAVENOUS | Status: AC
  Administered 2021-01-05: 1000 mg via INTRAVENOUS
  Filled 2021-01-05: qty 100

## 2021-01-05 MED ORDER — GADOBUTROL 1 MMOL/ML IV SOLN
9.0000 mL | Freq: Once | INTRAVENOUS | Status: AC | PRN
  Administered 2021-01-05: 9 mL via INTRAVENOUS

## 2021-01-05 MED ORDER — SODIUM CHLORIDE 0.9 % IV SOLN: INTRAVENOUS | Status: DC

## 2021-01-05 MED ORDER — LEVETIRACETAM 500 MG PO TABS: 500.0000 mg | ORAL_TABLET | Freq: Two times a day (BID) | ORAL | 0 refills | Status: DC

## 2021-01-05 NOTE — ED Notes (Signed)
Sent page to [facility] [phone]

## 2021-01-05 NOTE — Discharge Instructions (Signed)
Do not drive a car or operate any heavy machinery.  Do not take a bath alone or swim alone

## 2021-01-05 NOTE — ED Provider Notes (Signed)
[facility] EMERGENCY DEPARTMENT Provider Note   CSN: [id number] Arrival date & time: 01/05/21  5462     History Chief Complaint  Patient presents with  . Seizures    DANYALE RIDINGER is a 35 y.o. female.  35 year old female presents after having witnessed seizure just prior to arrival.  No history of same.  Significant other said patient had generalized tonic-clonic activity lasting approximately 5 minutes.  She was postictal afterwards.  Denies any illicit drug use or daily use of alcohol.  Has not had any headaches recently.  No bowel or bladder dysfunction.  No tongue injury.  Feels slightly drowsy but otherwise at her baseline.  Denies any chronic medication use        Past Medical History:  Diagnosis Date  . Hypertension     There are no problems to display for this patient.   History reviewed. No pertinent surgical history.   OB History   No obstetric history on file.     No family history on file.  Social History   Tobacco Use  . Smoking status: Current Every Day Smoker    Packs/day: 0.50  . Smokeless tobacco: Never Used  Substance Use Topics  . Alcohol use: No  . Drug use: No    Home Medications Prior to Admission medications   Medication Sig Start Date End Date Taking? Authorizing Provider  metroNIDAZOLE (FLAGYL) 500 MG tablet Take 1 tablet (500 mg total) by mouth 2 (two) times daily. One po bid x 7 days 08/08/15   Street, [place], [place]  naproxen (NAPROSYN) 500 MG tablet Take 1 tablet (500 mg total) by mouth 2 (two) times daily with a meal. x5-7 days 08/08/15   Street, [place], PA-C  silver sulfADIAZINE (SILVADENE) 1 % cream Apply 1 application topically 2 (two) times daily. 05/11/16   Julianne Rice, MD  traMADol (ULTRAM) 50 MG tablet Take 1 tablet (50 mg total) by mouth every 6 (six) hours as needed for severe pain. 05/11/16   Julianne Rice, MD    Allergies    Patient has no known allergies.  Review of Systems   Review  of Systems  All other systems reviewed and are negative.   Physical Exam Updated Vital Signs BP 136/85   Pulse 88   Temp (!) 97.3 F (36.3 C) (Temporal)   Resp 13   Ht 1.676 m (5\' 6" )   Wt 104.3 kg   LMP 01/03/2021   SpO2 97%   BMI 37.12 kg/m   Physical Exam Vitals and nursing note reviewed.  Constitutional:      General: She is not in acute distress.    Appearance: Normal appearance. She is well-developed. She is not toxic-appearing.  HENT:     Head: Normocephalic and atraumatic.  Eyes:     General: Lids are normal.     Conjunctiva/sclera: Conjunctivae normal.     Pupils: Pupils are equal, round, and reactive to light.  Neck:     Thyroid: No thyroid mass.     Trachea: No tracheal deviation.  Cardiovascular:     Rate and Rhythm: Normal rate and regular rhythm.     Heart sounds: Normal heart sounds. No murmur heard. No gallop.   Pulmonary:     Effort: Pulmonary effort is normal. No respiratory distress.     Breath sounds: Normal breath sounds. No stridor. No decreased breath sounds, wheezing, rhonchi or rales.  Abdominal:     General: Bowel sounds are normal. There is no distension.  Palpations: Abdomen is soft.     Tenderness: There is no abdominal tenderness. There is no rebound.  Musculoskeletal:        General: No tenderness. Normal range of motion.     Cervical back: Normal range of motion and neck supple.  Skin:    General: Skin is warm and dry.     Findings: No abrasion or rash.  Neurological:     General: No focal deficit present.     Mental Status: She is alert and oriented to person, place, and time.     GCS: GCS eye subscore is 4. GCS verbal subscore is 5. GCS motor subscore is 6.     Cranial Nerves: No cranial nerve deficit.     Sensory: No sensory deficit.  Psychiatric:        Speech: Speech normal.        Behavior: Behavior normal.     ED Results / Procedures / Treatments   Labs (all labs ordered are listed, but only abnormal results are  displayed) Labs Reviewed  CBC WITH DIFFERENTIAL/PLATELET  COMPREHENSIVE METABOLIC PANEL  ETHANOL  RAPID URINE DRUG SCREEN, HOSP PERFORMED  I-STAT BETA HCG BLOOD, ED ([facility], [facility], [facility] ONLY)    EKG None  Radiology No results found.  Procedures Procedures   Medications Ordered in ED Medications  0.9 %  sodium chloride infusion (has no administration in time range)    ED Course  I have reviewed the triage vital signs and the nursing notes.  Pertinent labs & imaging results that were available during my care of the patient were reviewed by me and considered in my medical decision making (see chart for details).    MDM Rules/Calculators/A&P                         Labs here are reassuring.  CT of the head showed questionable findings for mass.  Loaded with Keppra 1 g.  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  Discussed with Dr. Ellene Route from neurosurgery who recommends patient be placed on Keppra 500 mg twice daily and he will see in the office Final Clinical Impression(s) / ED Diagnoses Final diagnoses:  None    Rx / DC Orders ED Discharge Orders    None       Lacretia Leigh, MD 01/05/21 1218
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
## 2021-04-19 ENCOUNTER — Other Ambulatory Visit: Payer: Self-pay

## 2021-04-19 ENCOUNTER — Encounter (HOSPITAL_COMMUNITY): Payer: Self-pay

## 2021-04-19 ENCOUNTER — Emergency Department (HOSPITAL_COMMUNITY): Payer: Self-pay

## 2021-04-19 ENCOUNTER — Emergency Department (HOSPITAL_COMMUNITY)
Admission: EM | Admit: 2021-04-19 | Discharge: 2021-04-19 | Disposition: A | Discharge status: Home / Self Care | Payer: Self-pay | Attending: Emergency Medicine | Admitting: Emergency Medicine

## 2021-04-19 DIAGNOSIS — Z79899 Other long term (current) drug therapy: Secondary | ICD-10-CM | POA: Insufficient documentation

## 2021-04-19 DIAGNOSIS — R569 Unspecified convulsions: Secondary | ICD-10-CM

## 2021-04-19 DIAGNOSIS — G9389 Other specified disorders of brain: Secondary | ICD-10-CM

## 2021-04-19 DIAGNOSIS — F1290 Cannabis use, unspecified, uncomplicated: Secondary | ICD-10-CM | POA: Insufficient documentation

## 2021-04-19 DIAGNOSIS — I10 Essential (primary) hypertension: Secondary | ICD-10-CM | POA: Insufficient documentation

## 2021-04-19 DIAGNOSIS — F17210 Nicotine dependence, cigarettes, uncomplicated: Secondary | ICD-10-CM | POA: Insufficient documentation

## 2021-04-19 LAB — URINALYSIS, ROUTINE W REFLEX MICROSCOPIC
Bilirubin Urine: NEGATIVE
Glucose, UA: NEGATIVE mg/dL
Hgb urine dipstick: NEGATIVE
Ketones, ur: NEGATIVE mg/dL
Leukocytes,Ua: NEGATIVE
Nitrite: NEGATIVE
Protein, ur: 30 mg/dL — AB
Specific Gravity, Urine: 1.023 (ref 1.005–1.030)
pH: 6 (ref 5.0–8.0)

## 2021-04-19 LAB — I-STAT CHEM 8, ED
BUN: 12 mg/dL (ref 6–20)
Calcium, Ion: 1.21 mmol/L (ref 1.15–1.40)
Chloride: 105 mmol/L (ref 98–111)
Creatinine, Ser: 0.6 mg/dL (ref 0.44–1.00)
Glucose, Bld: 104 mg/dL — ABNORMAL HIGH (ref 70–99)
HCT: 38 % (ref 36.0–46.0)
Hemoglobin: 12.9 g/dL (ref 12.0–15.0)
Potassium: 4.1 mmol/L (ref 3.5–5.1)
Sodium: 138 mmol/L (ref 135–145)
TCO2: 23 mmol/L (ref 22–32)

## 2021-04-19 LAB — PREGNANCY, URINE: Preg Test, Ur: NEGATIVE

## 2021-04-19 MED ORDER — LEVETIRACETAM IN NACL 1000 MG/100ML IV SOLN
1000.0000 mg | Freq: Once | INTRAVENOUS | Status: AC
  Administered 2021-04-19: 1000 mg via INTRAVENOUS
  Filled 2021-04-19 (×2): qty 100

## 2021-04-19 MED ORDER — LEVETIRACETAM 500 MG PO TABS: 500.0000 mg | ORAL_TABLET | Freq: Two times a day (BID) | ORAL | 0 refills | Status: DC

## 2021-04-19 MED ORDER — GADOBUTROL 1 MMOL/ML IV SOLN
10.0000 mL | Freq: Once | INTRAVENOUS | Status: AC | PRN
  Administered 2021-04-19: 10 mL via INTRAVENOUS

## 2021-04-19 NOTE — ED Notes (Signed)
Pt aware urine sample needed 

## 2021-04-19 NOTE — Discharge Instructions (Addendum)
You were seen in the emergency department for evaluation of a seizure.  You had a repeat MRI that showed that that brain mass was slightly enlarged.  Dr. Reatha Armour from neurosurgery wants to see you in the office and they should call you with an appointment.  He also recommends restarting your seizure medication.  You can check with local pharmacies as you may find a better price but [facility] had the prescription for $9.  It will be important that you take this medication.  Return to the emergency department if any worsening or concerning symptoms

## 2021-04-19 NOTE — ED Notes (Signed)
Pt in MRI at this time 
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
## 2021-04-19 NOTE — ED Triage Notes (Signed)
Pt arrived via ems from home s/p seizure episode.  Pt reports she has only had a seizure once before and it was a few months ago.  EMS reported pt was post-ictal on arrival, incontinent of urine during seizure.  Pt a&ox4 currently.  EMS reported last set of vitals:  HR74 BP 142/80 98% on ra RR 20 CBG 129

## 2021-04-19 NOTE — Progress Notes (Signed)
Called by Dr. Melina Copa for evaluation of MRI discussion about patient's findings.  I reviewed her CT, MRI and MRI from April.  Apparently she has presented now with her second seizure however was not taking her antiepileptics.  I recommend continuing Keppra, and having her follow-up as an outpatient for surgical discussion.   Thank you for allowing me to participate in this patient's care.  Please do not hesitate to call with questions or concerns.   Elwin Sleight, [facility] Cell: [phone]

## 2022-03-17 IMAGING — MR MR HEAD WO/W CM
8 of 14 series · 26 of 48 positions shown · IV contrast (Yes GAD)
Comparison: None.

EXAM:
MRI HEAD WITHOUT AND WITH CONTRAST
TECHNIQUE: Multiplanar, multiecho pulse sequences of the brain and surrounding
structures were obtained without and with intravenous contrast.

CONTRAST:  9mL GADAVIST GADOBUTROL 1 MMOL/ML IV SOLN

[Series 2: DWI · axial · 3.0mm · 0.94mm/px · z∈[-127,+16]mm · 7 of 96 slices shown (1 of 2)]
[im 1/96]
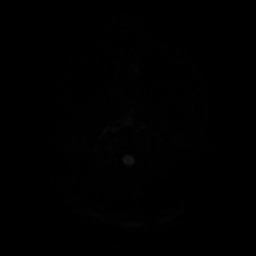
[im 16/96]
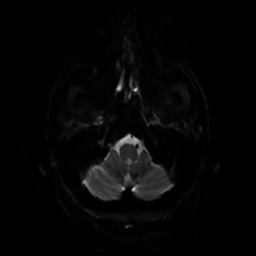
[im 32/96]
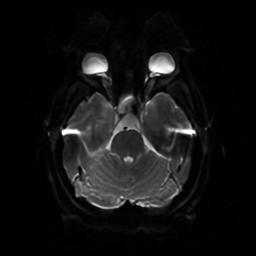
[im 48/96]
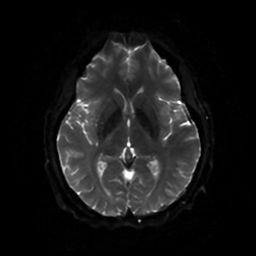
[im 64/96]
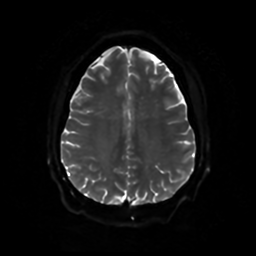
[im 80/96]
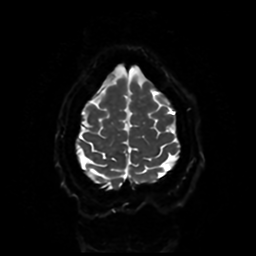
[im 96/96]
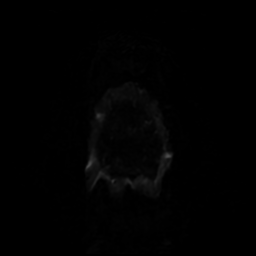

[Series 3: DWI · coronal · 4.0mm · 0.94mm/px · 5 of 68 slices shown (2 of 2)]
[im 1/68]
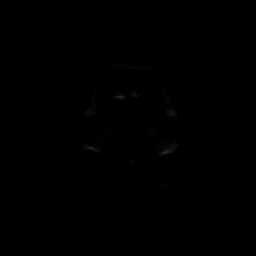
[im 17/68]
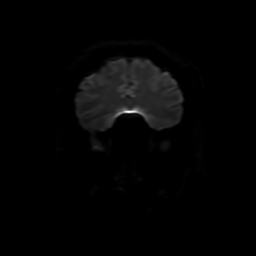
[im 34/68]
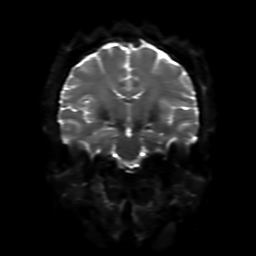
[im 51/68]
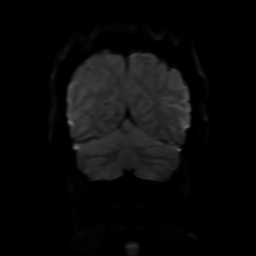
[im 68/68]
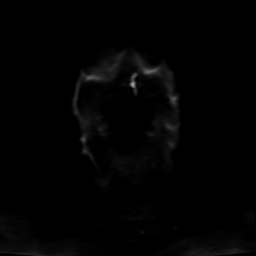

[Series 6: FLAIR · sagittal · 5.0mm · 0.23mm/px · 2 of 25 slices shown (1 of 3)]
[im 1/25]
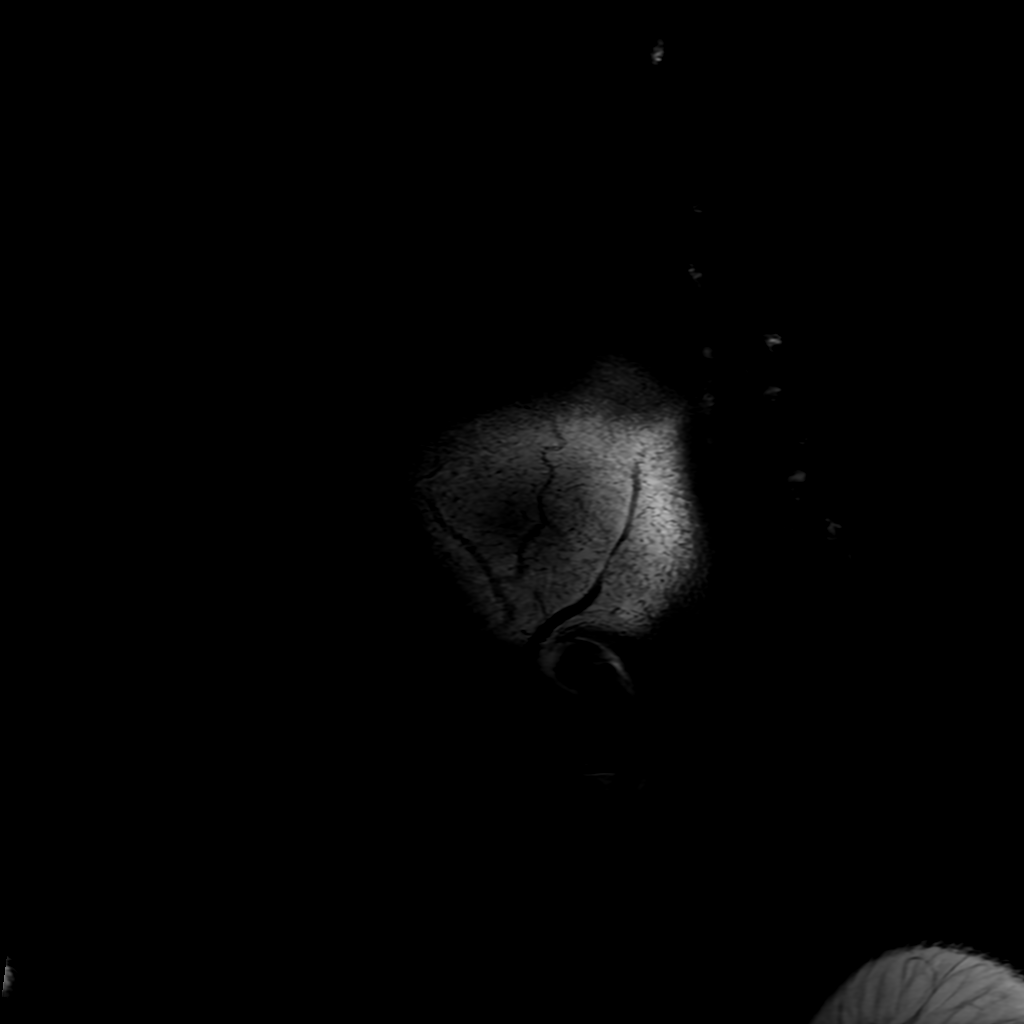
[im 25/25]
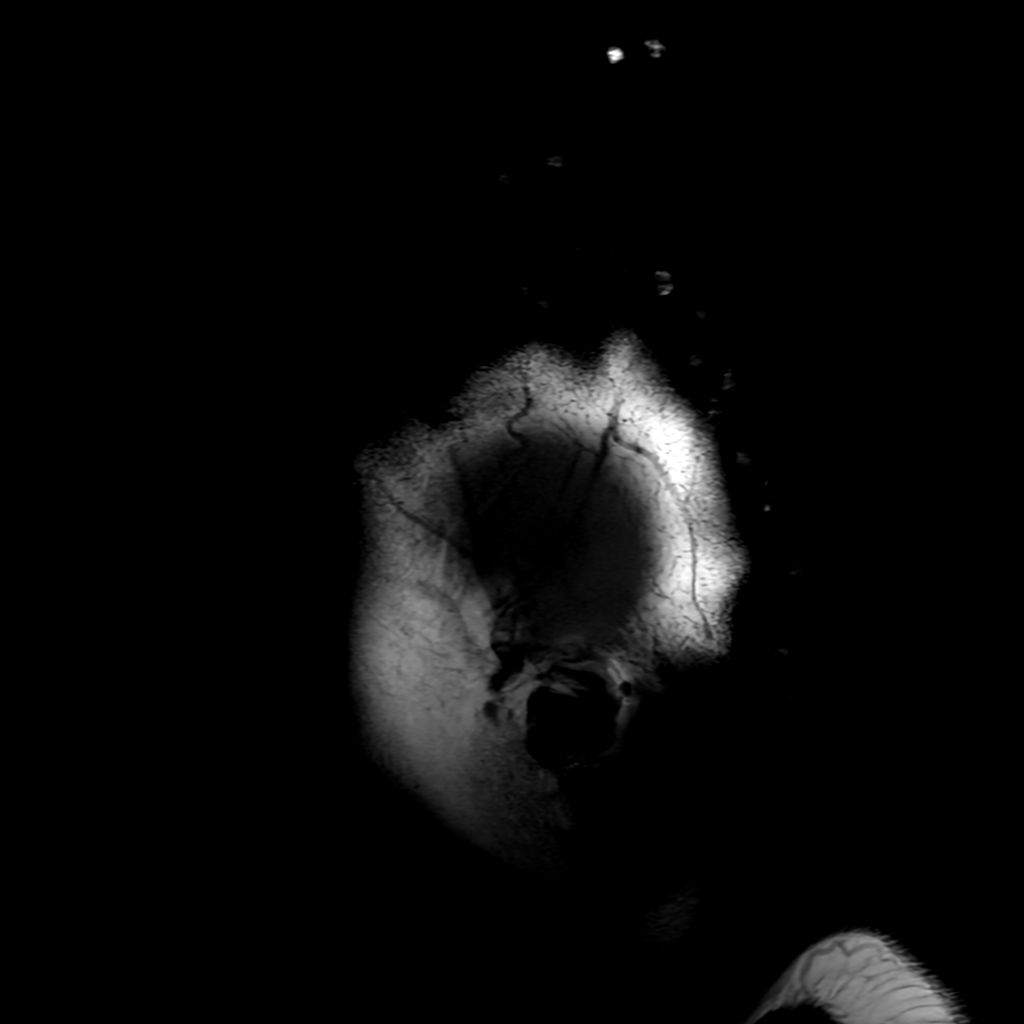

[Series 7: FLAIR · axial · 3.0mm · 0.45mm/px · z∈[-119,+12]mm · 2 of 23 slices shown (2 of 3)]
[im 1/23]
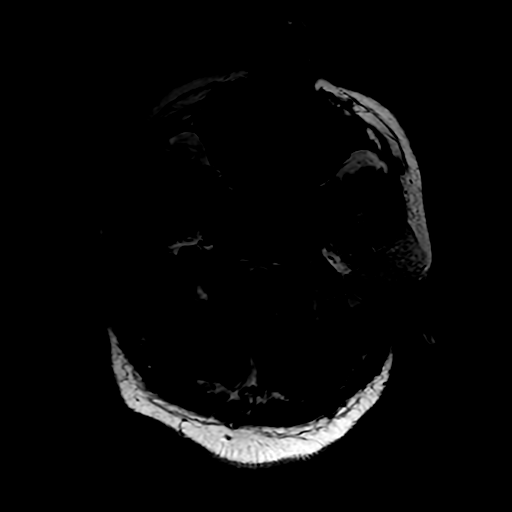
[im 23/23]
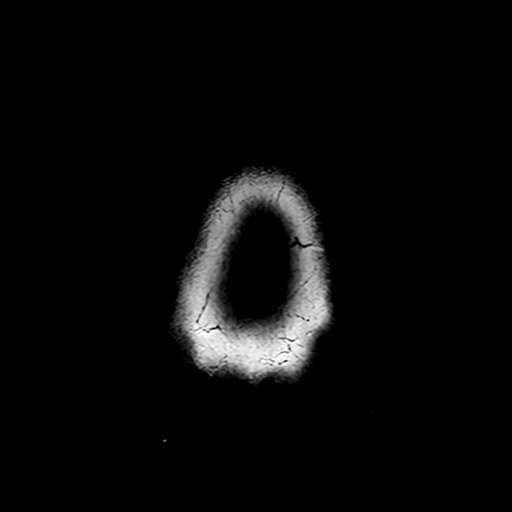

[Series 9: T2 · coronal · 3.0mm · 0.35mm/px · 1 of 29 slices shown]
[im 1/29]
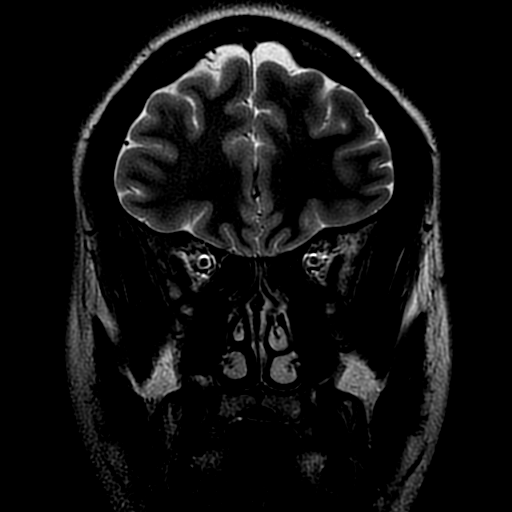

[Series 10: FLAIR · coronal · 3.0mm · 0.35mm/px · 2 of 29 slices shown (3 of 3)]
[im 1/29]
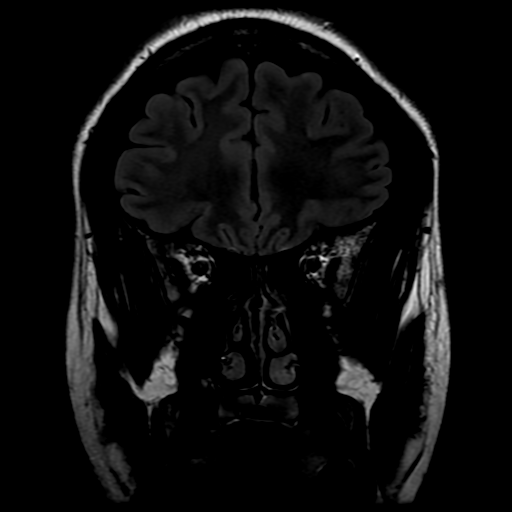
[im 29/29]
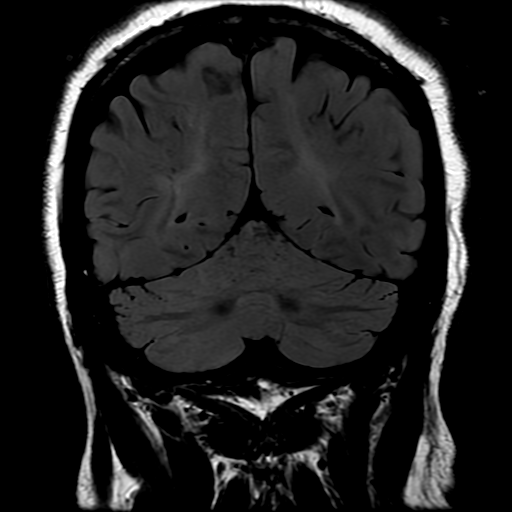

[Series 250: ADC · axial · 3.0mm · 0.94mm/px · z∈[-127,+16]mm · 4 of 49 slices shown (1 of 2)]
[im 1/49]
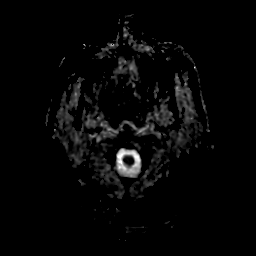
[im 17/49]
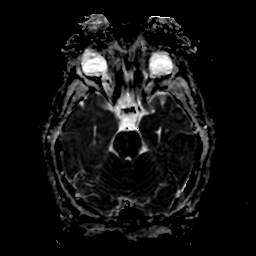
[im 33/49]
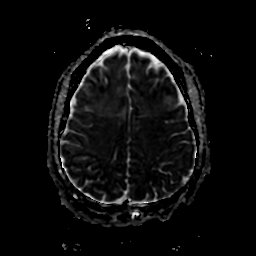
[im 49/49]
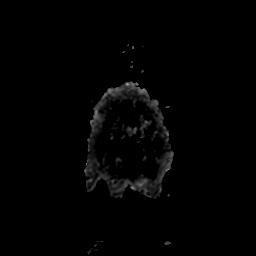

[Series 350: ADC · coronal · 4.0mm · 0.94mm/px · 3 of 34 slices shown (2 of 2)]
[im 1/34]
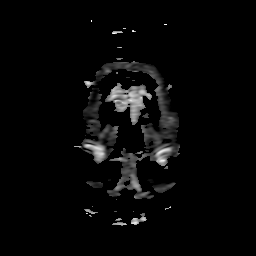
[im 17/34]
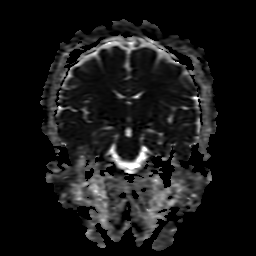
[im 34/34]
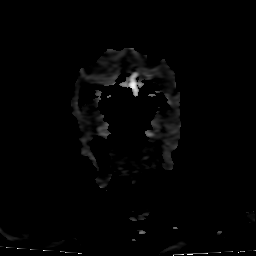

[26 of 48 positions shown; findings below may reference images not displayed]

FINDINGS: Brain: There is a homogeneously enhancing 1.3 x 1.0 cm lesion along
the anterior and superior right para hippocampal region which is
difficult to distinguish from surrounding cortex. The lesion is
slightly hyperintense on T2/FLAIR with suggestion of abnormal signal
extending beyond the tumors margin to involve the amygdala which is
thickened and mildly T2 hyperintense. The lesion is isointense to
cortex on T1 and T2 and demonstrates a small area of calcification
on same day head CT. No acute infarct. No acute hemorrhage. No
extra-axial fluid collection. Basal cisterns are patent.

Vascular: Major arterial flow voids are maintained at the skull
base.

Skull and upper cervical spine: Normal marrow signal.

Sinuses/Orbits: Clear sinuses.  Unremarkable orbits.

Other: No sizable mastoid effusions.
IMPRESSION: Approximately 1.3 cm right parahippocampal homogeneously enhancing
mass. It is difficult to assess whether this mass is intra-axial or
extra-axial given that the mass blends in with surrounding brain
parenchyma, but intra-axial location is favored given only small
dural abutment and no clear CSF cleft sign. Additionally,
surrounding signal abnormality in the amygdyla is atypical for
vasogenic edema and more suspicious for infiltrating tumor. For
these reasons, a primary intraparenchymal brain tumor is favored,
most likely a glioneuronal tumor in the setting of seizures (such as
a ganglioglioma given calcification seen on same day CT). Meningioma
is thought less likely.

## 2022-05-13 ENCOUNTER — Emergency Department (HOSPITAL_COMMUNITY): Payer: Self-pay

## 2022-05-13 ENCOUNTER — Other Ambulatory Visit: Payer: Self-pay

## 2022-05-13 ENCOUNTER — Emergency Department (HOSPITAL_COMMUNITY)
Admission: EM | Admit: 2022-05-13 | Discharge: 2022-05-13 | Disposition: A | Discharge status: Home / Self Care | Payer: Self-pay | Attending: Emergency Medicine | Admitting: Emergency Medicine

## 2022-05-13 DIAGNOSIS — D496 Neoplasm of unspecified behavior of brain: Secondary | ICD-10-CM | POA: Insufficient documentation

## 2022-05-13 DIAGNOSIS — R569 Unspecified convulsions: Secondary | ICD-10-CM | POA: Insufficient documentation

## 2022-05-13 LAB — BASIC METABOLIC PANEL
Anion gap: 8 (ref 5–15)
BUN: 12 mg/dL (ref 6–20)
CO2: 21 mmol/L — ABNORMAL LOW (ref 22–32)
Calcium: 8.5 mg/dL — ABNORMAL LOW (ref 8.9–10.3)
Chloride: 106 mmol/L (ref 98–111)
Creatinine, Ser: 0.86 mg/dL (ref 0.44–1.00)
GFR, Estimated: >60 mL/min (ref >60)
Glucose, Bld: 125 mg/dL — ABNORMAL HIGH (ref 70–99)
Potassium: 3.9 mmol/L (ref 3.5–5.1)
Sodium: 135 mmol/L (ref 135–145)

## 2022-05-13 LAB — ETHANOL: Alcohol, Ethyl (B): <10 mg/dL (ref <10)

## 2022-05-13 LAB — CBC WITH DIFFERENTIAL/PLATELET
Abs Immature Granulocytes: 0.02 10*3/uL (ref 0.00–0.07)
Basophils Absolute: 0 10*3/uL (ref 0.0–0.1)
Basophils Relative: 1 %
Eosinophils Absolute: 0.2 10*3/uL (ref 0.0–0.5)
Eosinophils Relative: 3 %
HCT: 39 % (ref 36.0–46.0)
Hemoglobin: 12.7 g/dL (ref 12.0–15.0)
Immature Granulocytes: 0 %
Lymphocytes Relative: 36 %
Lymphs Abs: 2.4 10*3/uL (ref 0.7–4.0)
MCH: 27.4 pg (ref 26.0–34.0)
MCHC: 32.6 g/dL (ref 30.0–36.0)
MCV: 84.1 fL (ref 80.0–100.0)
Monocytes Absolute: 0.5 10*3/uL (ref 0.1–1.0)
Monocytes Relative: 8 %
Neutro Abs: 3.4 10*3/uL (ref 1.7–7.7)
Neutrophils Relative %: 52 %
Platelets: 262 10*3/uL (ref 150–400)
RBC: 4.64 MIL/uL (ref 3.87–5.11)
RDW: 14.6 % (ref 11.5–15.5)
WBC: 6.5 10*3/uL (ref 4.0–10.5)
nRBC: 0 % (ref 0.0–0.2)

## 2022-05-13 LAB — I-STAT BETA HCG BLOOD, ED (MC, WL, AP ONLY): I-stat hCG, quantitative: <5 m[IU]/mL (ref <5)

## 2022-05-13 MED ORDER — LEVETIRACETAM 500 MG PO TABS: 500.0000 mg | ORAL_TABLET | Freq: Two times a day (BID) | ORAL | 1 refills | Status: DC

## 2022-05-13 MED ORDER — LEVETIRACETAM IN NACL 1000 MG/100ML IV SOLN
1000.0000 mg | Freq: Once | INTRAVENOUS | Status: AC
Start: 2022-05-13 — End: 2022-05-13
  Administered 2022-05-13: 1000 mg via INTRAVENOUS
  Filled 2022-05-13: qty 100

## 2022-05-13 MED ORDER — GADOBUTROL 1 MMOL/ML IV SOLN
11.0000 mL | Freq: Once | INTRAVENOUS | Status: AC | PRN
  Administered 2022-05-13: 11 mL via INTRAVENOUS

## 2022-05-13 NOTE — ED Provider Notes (Signed)
[facility] EMERGENCY DEPARTMENT Provider Note   CSN: [id number] Arrival date & time: 05/13/22  0630     History  Chief Complaint  Patient presents with   Seizures    Shelia Rasmussen is a 36 y.o. female.  36 year old female medical history as detailed below presents for evaluation.  She has a known history of brain tumor.  She is not currently under treatment for same.  She does have a history of seizure.  She has been prescribed Keppra in the past.  She reports that her last seizure was approximately 1 year ago.  She reports a likely recurrent seizure today.  Seizure occurred when she was asleep.  Her boyfriend felt her having a seizure while they were in bed.  Afterwards she awoke.  She did bite her tongue.  She otherwise feels fine now.  She denies headache or vision change.  She denies seizure more recently than 1 year ago.  She is not currently taking Keppra as previously prescribed.  She has not followed up with neurosurgery or neurology in the recent past with regard to her known brain tumor.   The history is provided by the patient and medical records.  Seizures Seizure activity on arrival: no   Seizure type:  Unable to specify Initial focality:  Unable to specify      Home Medications Prior to Admission medications   Medication Sig Start Date End Date Taking? Authorizing Provider  levETIRAcetam (KEPPRA) 500 MG tablet Take 1 tablet (500 mg total) by mouth 2 (two) times daily. 04/19/21   Hayden Rasmussen, MD      Allergies    Patient has no known allergies.    Review of Systems   Review of Systems  Neurological:  Positive for seizures.  All other systems reviewed and are negative.   Physical Exam Updated Vital Signs BP (!) 117/97   Pulse 68   Temp 97.9 F (36.6 C)   Resp 18   Ht '5\' 6"'$  (1.676 m)   Wt 107.5 kg   SpO2 95%   BMI 38.25 kg/m  Physical Exam Vitals and nursing note reviewed.  Constitutional:      General: She is not in  acute distress.    Appearance: Normal appearance. She is well-developed.  HENT:     Head: Normocephalic and atraumatic.  Eyes:     Conjunctiva/sclera: Conjunctivae normal.     Pupils: Pupils are equal, round, and reactive to light.  Cardiovascular:     Rate and Rhythm: Normal rate and regular rhythm.     Heart sounds: Normal heart sounds.  Pulmonary:     Effort: Pulmonary effort is normal. No respiratory distress.     Breath sounds: Normal breath sounds.  Abdominal:     General: There is no distension.     Palpations: Abdomen is soft.     Tenderness: There is no abdominal tenderness.  Musculoskeletal:        General: No deformity. Normal range of motion.     Cervical back: Normal range of motion and neck supple.  Skin:    General: Skin is warm and dry.  Neurological:     General: No focal deficit present.     Mental Status: She is alert and oriented to person, place, and time. Mental status is at baseline.     Cranial Nerves: No cranial nerve deficit.     Sensory: No sensory deficit.     Motor: No weakness.  Coordination: Coordination normal.     Gait: Gait normal.     ED Results / Procedures / Treatments   Labs (all labs ordered are listed, but only abnormal results are displayed) Labs Reviewed  CBC WITH DIFFERENTIAL/PLATELET  RAPID URINE DRUG SCREEN, HOSP PERFORMED  ETHANOL  BASIC METABOLIC PANEL  I-STAT BETA HCG BLOOD, ED ([place], [facility], [facility] ONLY)    EKG EKG Interpretation  Date/Time:  Monday May 13 2022 06:57:01 EDT Ventricular Rate:  60 PR Interval:  152 QRS Duration: 100 QT Interval:  414 QTC Calculation: 414 R Axis:   95 Text Interpretation: Normal sinus rhythm Rightward axis Borderline ECG No previous ECGs available Confirmed by Dene Gentry [phone]) on 05/13/2022 7:12:44 AM  Radiology No results found.  Procedures Procedures    Medications Ordered in ED Medications - No data to display  ED Course/ Medical Decision Making/ A&P                            Medical Decision Making Amount and/or Complexity of Data Reviewed Labs: ordered. Radiology: ordered.  Risk Prescription drug management.    Medical Screen Complete  This patient presented to the ED with complaint of seizure activity.  This complaint involves an extensive number of treatment options. The initial differential diagnosis includes, but is not limited to, recurrent seizure, metabolic abnormality, intracranial mass, etc.  This presentation is: Acute, Chronic, Self-Limited, Previously Undiagnosed, Uncertain Prognosis, Complicated, Systemic Symptoms, and Threat to Life/Bodily Function  Patient with known history of intracranial mass.  Patient without prior outpatient work-up of same.  Patient with poorly documented prior seizure activity.  Patient reports that she had a seizure this morning while she was asleep.  Her boyfriend noticed her moving and then called EMS.  She is without complaint on evaluation.    She admits that she has not been taking her Keppra as previously prescribed for quite some time.  She admits that she never followed up with neurology or neurosurgery for evaluation of her previously diagnosed intracranial mass.  Patient without abnormal findings on exam today.  Patient is comfortable.  Screening labs obtained are without significant abnormality.  MR brain demonstrates presence of unchanged tumor.  Case and imaging discussed with neurosurgery.  PA Reinaldo Meeker agrees with plan for outpatient follow-up with Dr. Annette Stable.  Patient does understand need for close outpatient follow-up.  Strict return precautions given and understood.  Patient is agreeable to taking Keppra so we will refill that today.  Patient understands strict seizure precautions and the need to not drive until she has been deemed safe to drive and operate heavy machinery by neurology and/or neurosurgery.  Additional history obtained:  External records from outside sources obtained and  reviewed including prior ED visits and prior Inpatient records.    Lab Tests:  I ordered and personally interpreted labs.  The pertinent results include: CBC, BMP, ethanol, hCG   Imaging Studies ordered:  I ordered imaging studies including CT head, MRI brain with and without contrast I independently visualized and interpreted obtained imaging which showed persistent right temporal lobe tumor I agree with the radiologist interpretation.   Cardiac Monitoring:  The patient was maintained on a cardiac monitor.  I personally viewed and interpreted the cardiac monitor which showed an underlying rhythm of: NSR   Medicines ordered:  I ordered medication including keppra  for seizure prophylaxis  Reevaluation of the patient after these medicines showed that the patient: improved   Problem  List / ED Course:  Reported recurrent seizure, intracranial mass   Reevaluation:  After the interventions noted above, I reevaluated the patient and found that they have: improved   Disposition:  After consideration of the diagnostic results and the patients response to treatment, I feel that the patent would benefit from close outpatient follow-up.          Final Clinical Impression(s) / ED Diagnoses Final diagnoses:  Seizure ([place])  Brain tumor [facility])    Rx / DC Orders ED Discharge Orders          Ordered    levETIRAcetam (KEPPRA) 500 MG tablet  2 times daily        05/13/22 1218              Valarie Merino, MD 05/13/22 1244

## 2022-05-13 NOTE — Discharge Instructions (Signed)
Return for any problem.  Follow-up closely with neurosurgery and neurology as instructed.  Do not drive or operate heavy machinery or engage in other risky activities until your seizures are controlled.

## 2022-05-13 NOTE — ED Triage Notes (Signed)
Pt bib [facility] from home after significant other witnessed full body seizure, unable to report how long seizure lasted. Hx seizures, reports brain tumor. Postictal with EMS, axo x4 upon arrival  BP 134/90 HR 72 SPO2 96% RR 14 CBG 142

## 2022-05-13 NOTE — ED Provider Triage Note (Signed)
Emergency Medicine Provider Triage Evaluation Note  Shelia Rasmussen , a 37 y.o. female  was evaluated in triage.  Pt complains of seizure.  She reports history of "brain tumor" and previous seizures but is not on any current medication  Review of Systems  Positive: Seizures   Physical Exam  Ht 1.676 m ('5\' 6"'$ )   Wt 107.5 kg   BMI 38.25 kg/m  Gen:   Awake, no distress   Resp:  Normal effort  MSK:   Moves extremities without difficulty  Other:  Patient is awake alert answers questions appropriately.  Medical Decision Making  Medically screening exam initiated at 6:41 AM.  Appropriate orders placed.  Shelia Rasmussen was informed that the remainder of the evaluation will be completed by another provider, this initial triage assessment does not replace that evaluation, and the importance of remaining in the ED until their evaluation is complete.  Seizure work-up has been initiated, including CT head.   Shelia Fraise, MD 05/13/22 [phone]

## 2022-08-12 ENCOUNTER — Encounter (HOSPITAL_COMMUNITY): Payer: Self-pay

## 2022-08-12 ENCOUNTER — Emergency Department (HOSPITAL_COMMUNITY)
Admission: EM | Admit: 2022-08-12 | Discharge: 2022-08-12 | Disposition: A | Discharge status: Home / Self Care | Payer: Commercial Managed Care - HMO | Attending: Emergency Medicine | Admitting: Emergency Medicine

## 2022-08-12 ENCOUNTER — Other Ambulatory Visit (HOSPITAL_COMMUNITY): Payer: Self-pay

## 2022-08-12 DIAGNOSIS — R569 Unspecified convulsions: Secondary | ICD-10-CM | POA: Diagnosis present

## 2022-08-12 HISTORY — DX: Unspecified convulsions: R56.9

## 2022-08-12 LAB — COMPREHENSIVE METABOLIC PANEL
ALT: 9 U/L (ref 0–44)
AST: 14 U/L — ABNORMAL LOW (ref 15–41)
Albumin: 3.5 g/dL (ref 3.5–5.0)
Alkaline Phosphatase: 36 U/L — ABNORMAL LOW (ref 38–126)
Anion gap: 8 (ref 5–15)
BUN: 10 mg/dL (ref 6–20)
CO2: 22 mmol/L (ref 22–32)
Calcium: 8.7 mg/dL — ABNORMAL LOW (ref 8.9–10.3)
Chloride: 108 mmol/L (ref 98–111)
Creatinine, Ser: 0.69 mg/dL (ref 0.44–1.00)
GFR, Estimated: >60 mL/min (ref >60)
Glucose, Bld: 103 mg/dL — ABNORMAL HIGH (ref 70–99)
Potassium: 4.2 mmol/L (ref 3.5–5.1)
Sodium: 138 mmol/L (ref 135–145)
Total Bilirubin: 0.4 mg/dL (ref 0.3–1.2)
Total Protein: 6.1 g/dL — ABNORMAL LOW (ref 6.5–8.1)

## 2022-08-12 LAB — CBC WITH DIFFERENTIAL/PLATELET
Abs Immature Granulocytes: 0.01 10*3/uL (ref 0.00–0.07)
Basophils Absolute: 0 10*3/uL (ref 0.0–0.1)
Basophils Relative: 1 %
Eosinophils Absolute: 0.1 10*3/uL (ref 0.0–0.5)
Eosinophils Relative: 3 %
HCT: 40 % (ref 36.0–46.0)
Hemoglobin: 13.3 g/dL (ref 12.0–15.0)
Immature Granulocytes: 0 %
Lymphocytes Relative: 39 %
Lymphs Abs: 1.9 10*3/uL (ref 0.7–4.0)
MCH: 27.4 pg (ref 26.0–34.0)
MCHC: 33.3 g/dL (ref 30.0–36.0)
MCV: 82.5 fL (ref 80.0–100.0)
Monocytes Absolute: 0.5 10*3/uL (ref 0.1–1.0)
Monocytes Relative: 10 %
Neutro Abs: 2.3 10*3/uL (ref 1.7–7.7)
Neutrophils Relative %: 47 %
Platelets: 260 10*3/uL (ref 150–400)
RBC: 4.85 MIL/uL (ref 3.87–5.11)
RDW: 14.4 % (ref 11.5–15.5)
WBC: 4.8 10*3/uL (ref 4.0–10.5)
nRBC: 0 % (ref 0.0–0.2)

## 2022-08-12 LAB — I-STAT BETA HCG BLOOD, ED (MC, WL, AP ONLY): I-stat hCG, quantitative: <5 m[IU]/mL (ref <5)

## 2022-08-12 LAB — CBG MONITORING, ED: Glucose-Capillary: 101 mg/dL — ABNORMAL HIGH (ref 70–99)

## 2022-08-12 MED ORDER — LEVETIRACETAM 500 MG/5ML IV SOLN
4500.0000 mg | Freq: Once | INTRAVENOUS | Status: AC
  Administered 2022-08-12: 4500 mg via INTRAVENOUS
  Filled 2022-08-12: qty 45

## 2022-08-12 MED ORDER — LEVETIRACETAM 500 MG PO TABS: 500.0000 mg | ORAL_TABLET | Freq: Once | ORAL | Status: DC

## 2022-08-12 MED ORDER — SODIUM CHLORIDE 0.9 % IV SOLN: 60.0000 mg/kg | Freq: Once | INTRAVENOUS | Status: DC

## 2022-08-12 MED ORDER — LEVETIRACETAM 500 MG PO TABS
500.0000 mg | ORAL_TABLET | Freq: Two times a day (BID) | ORAL | 2 refills | Status: DC
  Filled 2022-08-12: qty 60, 30d supply, fill #0

## 2022-08-12 NOTE — ED Triage Notes (Signed)
[place] [facility] from a hotel after witnessed grand mal seizure while sleeping, pt reports taking Keppra but out of medication for 2 weeks, pt AxO x4 upon arrival to ED, drowsy

## 2022-08-12 NOTE — ED Provider Notes (Signed)
[facility] EMERGENCY DEPARTMENT Provider Note   CSN: [id number] Arrival date & time: 08/12/22  1046     History  Chief Complaint  Patient presents with   Seizures    Shelia Rasmussen is a 36 y.o. female.  With past medical history significant for brain mass, seizures on Keppra, presenting with concerns for seizure-like activity earlier today.  Patient had witnessed tonic-clonic seizure activity and bed just prior to arrival.  She did bite her tongue and had urinary incontinence.  She had no preceding sick symptoms, and denies fever, nausea, vomiting, diarrhea.  She denies falls or injuries.  She did not fall to the ground during this event as she was in her bed.  She states she has not taken her Keppra in approximately 2 weeks as she has been able to afford her medication.  She is aware of the brain mass, and has not yet followed up with neurosurgery.  Seizures Seizure activity on arrival: no   Seizure type:  Grand mal Episode characteristics: generalized shaking, incontinence and tongue biting   Postictal symptoms: somnolence   Return to baseline: yes   Timing:  Once Number of seizures this episode:  1 Context: not fever   Context comment:  Unable to access medications for 2 weeks due to cost. Known brain mass Recent head injury:  No recent head injuries History of seizures: yes        Home Medications Prior to Admission medications   Medication Sig Start Date End Date Taking? Authorizing Provider  levETIRAcetam (KEPPRA) 500 MG tablet Take 1 tablet (500 mg total) by mouth 2 (two) times daily. 08/12/22 11/10/22 Yes Luster Landsberg, MD      Allergies    Patient has no known allergies.    Review of Systems   Review of Systems  Neurological:  Positive for seizures.    Physical Exam Updated Vital Signs BP 121/74   Pulse 65   Temp 98.9 F (37.2 C) (Oral)   Resp 15   Ht '5\' 6"'$  (1.676 m)   Wt 107.5 kg   SpO2 98%   BMI 38.25 kg/m  Physical  Exam Vitals and nursing note reviewed.  Constitutional:      General: She is not in acute distress.    Appearance: She is well-developed.  HENT:     Head: Normocephalic and atraumatic.     Nose: Nose normal.     Mouth/Throat:     Mouth: Mucous membranes are moist.     Pharynx: Oropharynx is clear. No oropharyngeal exudate or posterior oropharyngeal erythema.  Eyes:     General: No visual field deficit.    Extraocular Movements: Extraocular movements intact.     Conjunctiva/sclera: Conjunctivae normal.     Pupils: Pupils are equal, round, and reactive to light.  Cardiovascular:     Rate and Rhythm: Normal rate and regular rhythm.     Heart sounds: No murmur heard. Pulmonary:     Effort: Pulmonary effort is normal. No respiratory distress.     Breath sounds: Normal breath sounds.  Abdominal:     Palpations: Abdomen is soft.     Tenderness: There is no abdominal tenderness.  Musculoskeletal:        General: No swelling.     Cervical back: Normal range of motion and neck supple.  Skin:    General: Skin is warm and dry.     Capillary Refill: Capillary refill takes less than 2 seconds.  Neurological:  Mental Status: She is alert and oriented to person, place, and time.     Cranial Nerves: No cranial nerve deficit, dysarthria or facial asymmetry.     Sensory: No sensory deficit.     Motor: No weakness, tremor, abnormal muscle tone or pronator drift.     Coordination: Coordination normal. Finger-Nose-Finger Test normal.  Psychiatric:        Mood and Affect: Mood normal.     ED Results / Procedures / Treatments   Labs (all labs ordered are listed, but only abnormal results are displayed) Labs Reviewed  COMPREHENSIVE METABOLIC PANEL - Abnormal; Notable for the following components:      Result Value   Glucose, Bld 103 (*)    Calcium 8.7 (*)    Total Protein 6.1 (*)    AST 14 (*)    Alkaline Phosphatase 36 (*)    All other components within normal limits  CBG MONITORING,  ED - Abnormal; Notable for the following components:   Glucose-Capillary 101 (*)    All other components within normal limits  CBC WITH DIFFERENTIAL/PLATELET  I-STAT BETA HCG BLOOD, ED ([facility], [facility], [facility] ONLY)    EKG EKG Interpretation  Date/Time:  Monday August 12 2022 10:53:10 EST Ventricular Rate:  68 PR Interval:  148 QRS Duration: 94 QT Interval:  399 QTC Calculation: 425 R Axis:   95 Text Interpretation: Sinus rhythm Borderline right axis deviation Consider left ventricular hypertrophy Borderline T abnormalities, anterior leads No significant change since last tracing Confirmed by Isla Pence [phone]) on 08/12/2022 10:55:54 AM  Radiology No results found.  Procedures Procedures    Medications Ordered in ED Medications  levETIRAcetam (KEPPRA) 4,500 mg in sodium chloride 0.9 % 250 mL IVPB (0 mg Intravenous Stopped 08/12/22 1304)    ED Course/ Medical Decision Making/ A&P                           Medical Decision Making Patient is a 36 year old female with past medical history as noted above.  I think her seizure is most likely secondary to not taking her Keppra.  She has not had any sick symptoms concerning for infectious etiology lowering seizure threshold or meningitis.  No falls or trauma concerning for traumatic etiology.  She does have known brain mass.  MRI obtained in August 2023.  She has not been able to follow-up with neurosurgery for surgical conversation.  She is alert and oriented x4 at this time.  She does states she still feels sleepy.  Keppra loading dose ordered.  Screening labs ordered.  Consult to social work placed for assistance in affording medications and assistance and being plugged into the system for follow-up with neurosurgery.  Amount and/or Complexity of Data Reviewed Labs: ordered. Decision-making details documented in ED Course.  Risk Prescription drug management.   CMP: Electrolyte within normal limits, glucose 103, LFTs unremarkable.  No  anion gap.  CBC with differential: Within normal limits  Social work was able to assist with filling patient's prescriptions.  She has a follow-up appointment with PCP scheduled.  She has returned to baseline and is stable for discharge at this time.  Return precautions given.        Final Clinical Impression(s) / ED Diagnoses Final diagnoses:  Seizure ([place])    Rx / DC Orders ED Discharge Orders          Ordered    levETIRAcetam (KEPPRA) 500 MG tablet  2 times daily  08/12/22 1141              Luster Landsberg, MD 08/12/22 1703    Isla Pence, MD 08/13/22 [phone]

## 2022-08-12 NOTE — Discharge Instructions (Addendum)
You came to the emergency department today after a seizure.  Your labs today looked okay.  We gave you IV Keppra.  I have sent a prescription of Keppra to the [facility] pharmacy.  They should be able to help you fill this prescription.  Our social worker who came and spoke to will be making a referral to a primary care provider in creating an appointment for you.  This primary care provider can refer you to neurosurgery so that you can talk to them about surgical options for your tumor.  Please return to the emergency department if you have new or worsening symptoms especially numbness or tingling, weakness on one side of your body, facial droop, altered mental status, or if you are concerned you are having a medical emergency.

## 2022-08-12 NOTE — Discharge Planning (Signed)
RNCM consulted regarding PCP establishment. Pt presented to [facility] ED today with seizure disorder. RNCM met with pt at bedside; pt confirms not having access to follow up care with PCP.  RNCM obtained appointment on (08/23/22), time (7711) with Dr Farrel Gordon and placed on After Visit Summary paperwork.  No further case management needs communicated at this time. Preston Weill J. Clydene Laming, [place], [place], [place]

## 2022-08-23 ENCOUNTER — Ambulatory Visit: Payer: Commercial Managed Care - HMO | Admitting: Internal Medicine

## 2022-10-21 ENCOUNTER — Other Ambulatory Visit (HOSPITAL_COMMUNITY): Payer: Self-pay

## 2022-10-28 ENCOUNTER — Emergency Department (HOSPITAL_COMMUNITY): Payer: Medicaid Other

## 2022-10-28 ENCOUNTER — Encounter (HOSPITAL_COMMUNITY): Payer: Self-pay | Admitting: Emergency Medicine

## 2022-10-28 ENCOUNTER — Emergency Department (HOSPITAL_COMMUNITY)
Admission: EM | Admit: 2022-10-28 | Discharge: 2022-10-28 | Disposition: A | Discharge status: Home / Self Care | Payer: Medicaid Other | Attending: Emergency Medicine | Admitting: Emergency Medicine

## 2022-10-28 ENCOUNTER — Other Ambulatory Visit: Payer: Self-pay

## 2022-10-28 DIAGNOSIS — R569 Unspecified convulsions: Secondary | ICD-10-CM | POA: Insufficient documentation

## 2022-10-28 DIAGNOSIS — S01512A Laceration without foreign body of oral cavity, initial encounter: Secondary | ICD-10-CM | POA: Diagnosis not present

## 2022-10-28 DIAGNOSIS — F17210 Nicotine dependence, cigarettes, uncomplicated: Secondary | ICD-10-CM | POA: Diagnosis not present

## 2022-10-28 DIAGNOSIS — G9389 Other specified disorders of brain: Secondary | ICD-10-CM | POA: Insufficient documentation

## 2022-10-28 DIAGNOSIS — Z1152 Encounter for screening for COVID-19: Secondary | ICD-10-CM | POA: Insufficient documentation

## 2022-10-28 DIAGNOSIS — I10 Essential (primary) hypertension: Secondary | ICD-10-CM | POA: Insufficient documentation

## 2022-10-28 DIAGNOSIS — X58XXXA Exposure to other specified factors, initial encounter: Secondary | ICD-10-CM | POA: Insufficient documentation

## 2022-10-28 LAB — COMPREHENSIVE METABOLIC PANEL
ALT: 13 U/L (ref 0–44)
AST: 17 U/L (ref 15–41)
Albumin: 3.7 g/dL (ref 3.5–5.0)
Alkaline Phosphatase: 35 U/L — ABNORMAL LOW (ref 38–126)
Anion gap: 10 (ref 5–15)
BUN: 8 mg/dL (ref 6–20)
CO2: 19 mmol/L — ABNORMAL LOW (ref 22–32)
Calcium: 8.5 mg/dL — ABNORMAL LOW (ref 8.9–10.3)
Chloride: 109 mmol/L (ref 98–111)
Creatinine, Ser: 0.63 mg/dL (ref 0.44–1.00)
GFR, Estimated: >60 mL/min (ref >60)
Glucose, Bld: 100 mg/dL — ABNORMAL HIGH (ref 70–99)
Potassium: 3.9 mmol/L (ref 3.5–5.1)
Sodium: 138 mmol/L (ref 135–145)
Total Bilirubin: 0.9 mg/dL (ref 0.3–1.2)
Total Protein: 6.9 g/dL (ref 6.5–8.1)

## 2022-10-28 LAB — CBC WITH DIFFERENTIAL/PLATELET
Abs Immature Granulocytes: 0.02 10*3/uL (ref 0.00–0.07)
Basophils Absolute: 0 10*3/uL (ref 0.0–0.1)
Basophils Relative: 0 %
Eosinophils Absolute: 0.1 10*3/uL (ref 0.0–0.5)
Eosinophils Relative: 3 %
HCT: 42.8 % (ref 36.0–46.0)
Hemoglobin: 13.9 g/dL (ref 12.0–15.0)
Immature Granulocytes: 0 %
Lymphocytes Relative: 36 %
Lymphs Abs: 2 10*3/uL (ref 0.7–4.0)
MCH: 27.3 pg (ref 26.0–34.0)
MCHC: 32.5 g/dL (ref 30.0–36.0)
MCV: 83.9 fL (ref 80.0–100.0)
Monocytes Absolute: 0.5 10*3/uL (ref 0.1–1.0)
Monocytes Relative: 8 %
Neutro Abs: 2.9 10*3/uL (ref 1.7–7.7)
Neutrophils Relative %: 53 %
Platelets: 297 10*3/uL (ref 150–400)
RBC: 5.1 MIL/uL (ref 3.87–5.11)
RDW: 14.2 % (ref 11.5–15.5)
WBC: 5.6 10*3/uL (ref 4.0–10.5)
nRBC: 0 % (ref 0.0–0.2)

## 2022-10-28 LAB — RESP PANEL BY RT-PCR (RSV, FLU A&B, COVID)  RVPGX2
Influenza A by PCR: NEGATIVE
Influenza B by PCR: NEGATIVE
Resp Syncytial Virus by PCR: NEGATIVE
SARS Coronavirus 2 by RT PCR: NEGATIVE

## 2022-10-28 LAB — ETHANOL: Alcohol, Ethyl (B): <10 mg/dL (ref <10)

## 2022-10-28 LAB — CK: Total CK: 143 U/L (ref 38–234)

## 2022-10-28 MED ORDER — ONDANSETRON HCL 4 MG/2ML IJ SOLN
4.0000 mg | Freq: Once | INTRAMUSCULAR | Status: AC
  Administered 2022-10-28: 4 mg via INTRAVENOUS
  Filled 2022-10-28: qty 2

## 2022-10-28 MED ORDER — LEVETIRACETAM 500 MG PO TABS: 500.0000 mg | ORAL_TABLET | Freq: Two times a day (BID) | ORAL | 1 refills | Status: DC

## 2022-10-28 MED ORDER — LEVETIRACETAM IN NACL 1500 MG/100ML IV SOLN
1500.0000 mg | Freq: Once | INTRAVENOUS | Status: AC
  Administered 2022-10-28: 1500 mg via INTRAVENOUS
  Filled 2022-10-28: qty 100

## 2022-10-28 MED ORDER — SODIUM CHLORIDE 0.9 % IV BOLUS
1000.0000 mL | Freq: Once | INTRAVENOUS | Status: AC
  Administered 2022-10-28: 1000 mL via INTRAVENOUS

## 2022-10-28 NOTE — ED Triage Notes (Signed)
Patient presents from home due to a grand mal seizure. Patient was post ictal with EMS. Patient takes Keppra twice a day but hadn't taken it this AM.   Hx: Epilepsia,  Brain tumor  EMS vitals: 136/84 BP 68 HR 99% SPO2 on room air 146 CBG

## 2022-10-28 NOTE — ED Provider Notes (Signed)
[facility] Provider Note  CSN: [id number] Arrival date & time: 10/28/22 1351  Chief Complaint(s) No chief complaint on file.  HPI Shelia Rasmussen is a 37 y.o. female with past medical history as below, significant for hypertension, seizures on Keppra, right temporal lobe mass who presents to the ED with complaint of seizure.  Patient reports that she forgot take her Keppra, missed 1-2 doses.  Had a seizure earlier this morning while she was asleep it was witnessed by the spouse.  Resolved without intervention.  She was incontinent of urine and did have a lateral tongue laceration.  She has some mild nausea at this time and a mild headache otherwise feels back to her baseline.  No numbness or tingling that seems new.  Multiple seizures over the past 6 months, she is also not follow-up with neurosurgery yet because she is worried/scared about any possible surgical intervention.  No recent licit drug use, no recent alcohol use, tolerate p.o. intake without difficulty, otherwise no acute complaints.  Past Medical History Past Medical History:  Diagnosis Date   Hypertension    Seizure ([place])    There are no problems to display for this patient.  Home Medication(s) Prior to Admission medications   Medication Sig Start Date End Date Taking? Authorizing Provider  levETIRAcetam (KEPPRA) 500 MG tablet Take 1 tablet (500 mg total) by mouth 2 (two) times daily. 10/28/22 12/27/22 Yes Wynona Dove A, DO  levETIRAcetam (KEPPRA) 500 MG tablet Take 1 tablet (500 mg total) by mouth 2 (two) times daily. 08/12/22 11/10/22  Luster Landsberg, MD                                                                                                                                    Past Surgical History History reviewed. No pertinent surgical history. Family History History reviewed. No pertinent family history.  Social History Social History   Tobacco Use   Smoking status:  Every Day    Packs/day: 0.50    Types: Cigarettes   Smokeless tobacco: Never  Substance Use Topics   Alcohol use: No   Drug use: No   Allergies Patient has no known allergies.  Review of Systems Review of Systems  Constitutional:  Negative for activity change and fever.  HENT:  Negative for facial swelling and trouble swallowing.   Eyes:  Negative for discharge and redness.  Respiratory:  Negative for cough and shortness of breath.   Cardiovascular:  Negative for chest pain and palpitations.  Gastrointestinal:  Positive for nausea. Negative for abdominal pain and vomiting.  Genitourinary:  Negative for dysuria and flank pain.  Musculoskeletal:  Negative for back pain and gait problem.  Skin:  Negative for pallor and rash.  Neurological:  Positive for seizures and headaches. Negative for syncope.    Physical Exam Vital Signs  I have reviewed the triage vital signs BP (!) 168/94 (  BP Location: Left Arm)   Pulse 70   Temp 97.9 F (36.6 C) (Oral)   Resp 18   SpO2 98%  Physical Exam Vitals and nursing note reviewed.  Constitutional:      General: She is not in acute distress.    Appearance: Normal appearance. She is not ill-appearing, toxic-appearing or diaphoretic.  HENT:     Head: Normocephalic and atraumatic. No raccoon eyes, Battle's sign, right periorbital erythema or left periorbital erythema.     Jaw: There is normal jaw occlusion.     Comments: Left lateral tongue laceration, no ongoing bleeding     Right Ear: External ear normal.     Left Ear: External ear normal.     Nose: Nose normal.     Mouth/Throat:     Mouth: Mucous membranes are moist.  Eyes:     General: No scleral icterus.       Right eye: No discharge.        Left eye: No discharge.     Extraocular Movements: Extraocular movements intact.     Pupils: Pupils are equal, round, and reactive to light.  Cardiovascular:     Rate and Rhythm: Normal rate and regular rhythm.     Pulses: Normal pulses.      Heart sounds: Normal heart sounds.  Pulmonary:     Effort: Pulmonary effort is normal. No respiratory distress.     Breath sounds: Normal breath sounds.  Abdominal:     General: Abdomen is flat.     Tenderness: There is no abdominal tenderness.  Musculoskeletal:        General: Normal range of motion.     Cervical back: Normal range of motion.     Right lower leg: No edema.     Left lower leg: No edema.  Skin:    General: Skin is warm and dry.     Capillary Refill: Capillary refill takes less than 2 seconds.  Neurological:     Mental Status: She is alert and oriented to person, place, and time.     GCS: GCS eye subscore is 4. GCS verbal subscore is 5. GCS motor subscore is 6.     Cranial Nerves: Cranial nerves 2-12 are intact. No dysarthria.     Sensory: Sensation is intact.     Motor: Motor function is intact. No tremor.     Coordination: Coordination is intact.     Comments: Gait not tested 2/2 pt safety   Psychiatric:        Mood and Affect: Mood normal.        Behavior: Behavior normal.     ED Results and Treatments Labs (all labs ordered are listed, but only abnormal results are displayed) Labs Reviewed  COMPREHENSIVE METABOLIC PANEL - Abnormal; Notable for the following components:      Result Value   CO2 19 (*)    Glucose, Bld 100 (*)    Calcium 8.5 (*)    Alkaline Phosphatase 35 (*)    All other components within normal limits  RESP PANEL BY RT-PCR (RSV, FLU A&B, COVID)  RVPGX2  ETHANOL  CBC WITH DIFFERENTIAL/PLATELET  CK  LEVETIRACETAM LEVEL  Radiology CT Head Wo Contrast  Result Date: 10/28/2022 CLINICAL DATA:  Headache, increasing frequency or severity brain lesion EXAM: CT HEAD WITHOUT CONTRAST TECHNIQUE: Contiguous axial images were obtained from the base of the skull through the vertex without intravenous contrast. RADIATION DOSE  REDUCTION: This exam was performed according to the departmental dose-optimization program which includes automated exposure control, adjustment of the mA and/or kV according to patient size and/or use of iterative reconstruction technique. COMPARISON:  Head CT and brain MRI 05/13/2022 FINDINGS: Brain: Known lesion in the right medial temporal lobe measures approximately 17 x 15 mm and is slightly hyperdense to brain parenchyma, series 2, image 8. There is some peripheral calcification in the inferior aspect. No gross change from prior CT. No associated midline shift or significant mass effect. No acute hemorrhage. No hydrocephalus, evidence of acute ischemia or subdural/extra-axial collection. 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 No new lesions evident by CT. Vascular: No hyperdense vessel or unexpected calcification. Skull: No fracture or focal lesion. Sinuses/Orbits: Paranasal sinuses and mastoid air cells are clear. The visualized orbits are unremarkable. Other: None. IMPRESSION: 1. No acute intracranial abnormality. 2. Known lesion in the right medial temporal lobe is a centrally unchanged in size from prior CT. Electronically Signed   By: Keith Rake M.D.   On: 10/28/2022 15:36    Pertinent labs & imaging results that were available during my care of the patient were reviewed by me and considered in my medical decision making (see MDM for details).  Medications Ordered in ED Medications  levETIRAcetam (KEPPRA) IVPB 1500 mg/ 100 mL premix (0 mg Intravenous Stopped 10/28/22 1757)  ondansetron (ZOFRAN) injection 4 mg (4 mg Intravenous Given 10/28/22 1448)  sodium chloride 0.9 % bolus 1,000 mL (0 mLs Intravenous Stopped 10/28/22 1757)                                                                                                                                     Procedures Procedures  (including critical care time)  Medical Decision Making / ED Course    Medical Decision Making:    Shelia Rasmussen is a 37 y.o. 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 The complaint involves an extensive differential diagnosis and also carries with it a high risk of complications and morbidity.  Serious etiology was considered. Ddx includes but is not limited to: breakthrough seizure, provoked seizure, metabolic derangement, worsening intracranial lesion, etc  Complete initial physical exam performed, notably the patient  was NAD, neuroexam is nonfocal, no external signs of trauma other than tongue laceration..    Reviewed and confirmed nursing documentation for past medical history, family history, social history.  Vital signs reviewed.      Patient with known history of seizures and likely brain neoplasm for which she has yet to seek  care for.  She missed dose of Keppra which is likely provoking factor in her seizure today.  She is back to baseline.  Labs stable.  CT head was reviewed and mass- Stable.  Loaded with Keppra in ED.  Given refill on her Keppra, advised her to see neurosurgery for further treatment of brain mass.  Discussed seizure precautions.  Tolerating p.o., ambulatory with steady gait.  Feels back to baseline.  Encouraged tobacco cessation.  Family to pick her up.  The patient improved significantly and was discharged in stable condition. Detailed discussions were had with the patient regarding current findings, and need for close f/u with PCP or on call doctor. The patient has been instructed to return immediately if the symptoms worsen in any way for re-evaluation. Patient verbalized understanding and is in agreement with current care plan. All questions answered prior to discharge.    Additional history obtained: -Additional history obtained from na -External records from outside source obtained and reviewed including: Chart review including previous notes,  labs, imaging, consultation notes including prior ED visits, prior consultant notes, primary care documentation, home medications, prior labs and imaging   Lab Tests: -I ordered, reviewed, and interpreted labs.   The pertinent results include:   Labs Reviewed  COMPREHENSIVE METABOLIC PANEL - Abnormal; Notable for the following components:      Result Value   CO2 19 (*)    Glucose, Bld 100 (*)    Calcium 8.5 (*)    Alkaline Phosphatase 35 (*)    All other components within normal limits  RESP PANEL BY RT-PCR (RSV, FLU A&B, COVID)  RVPGX2  ETHANOL  CBC WITH DIFFERENTIAL/PLATELET  CK  LEVETIRACETAM LEVEL    Notable for Labs stable  EKG   EKG Interpretation  Date/Time:    Ventricular Rate:    PR Interval:    QRS Duration:   QT Interval:    QTC Calculation:   R Axis:     Text Interpretation:           Imaging Studies ordered: I ordered imaging studies including CT head I independently visualized the following imaging with scope of interpretation limited to determining acute life threatening conditions related to emergency care: CT head shows persistent temporal mass without vasogenic edema I independently visualized and interpreted imaging. I agree with the radiologist interpretation   Medicines ordered and prescription drug management: Meds ordered this encounter  Medications   levETIRAcetam (KEPPRA) IVPB 1500 mg/ 100 mL premix   ondansetron (ZOFRAN) injection 4 mg   sodium chloride 0.9 % bolus 1,000 mL   levETIRAcetam (KEPPRA) 500 MG tablet    Sig: Take 1 tablet (500 mg total) by mouth 2 (two) times daily.    Dispense:  60 tablet    Refill:  1    -I have reviewed the patients home medicines and have made adjustments as needed   Consultations Obtained: na   Cardiac Monitoring: The patient was maintained on a cardiac monitor.  I personally viewed and interpreted the cardiac monitored which showed an underlying rhythm of: nsr  Social Determinants of  Health:  Diagnosis or treatment significantly limited by social determinants of health: current smoker Counseled patient for approximately 2 minutes regarding smoking cessation. Discussed risks of smoking and how they applied and affected their visit here today. Patient not ready to quit at this time, however will follow up with their primary doctor when they are.   CPT code: [phone]: intermediate counseling for smoking cessation  Reevaluation: After the interventions noted above, I reevaluated the patient and found that they have resolved  Co morbidities that complicate the patient evaluation  Past Medical History:  Diagnosis Date   Hypertension    Seizure ([place])       Dispostion: Disposition decision including need for hospitalization was considered, and patient discharged from emergency department.    Final Clinical Impression(s) / ED Diagnoses Final diagnoses:  Seizure ([place])  Right temporal lobe mass     This chart was dictated using voice recognition software.  Despite best efforts to proofread,  errors can occur which can change the documentation meaning.    Jeanell Sparrow, DO 10/29/22 [phone]

## 2022-10-28 NOTE — ED Notes (Signed)
Patient reports that she only has seizures when she is sleeping.

## 2022-10-28 NOTE — Discharge Instructions (Addendum)
Please follow up with neurosurgery in regards to lesion on your brain   Per [facility] statutes, patients with seizures are not allowed to drive until they have been seizure-free for six months.  Other recommendations include using caution when using heavy equipment or power tools. Avoid working on ladders or at heights. Take showers instead of baths.  Do not swim alone.  Ensure the water temperature is not too high on the home water heater. Do not go swimming alone. Do not lock yourself in a room alone (i.e. bathroom). When caring for infants or small children, sit down when holding, feeding, or changing them to minimize risk of injury to the child in the event you have a seizure. Maintain good sleep hygiene. Avoid alcohol.  Also recommend adequate sleep, hydration, good diet and minimize stress.     During the Seizure   - First, ensure adequate ventilation and place patients on the floor on their left side  Loosen clothing around the neck and ensure the airway is patent. If the patient is clenching the teeth, do not force the mouth open with any object as this can cause severe damage - Remove all items from the surrounding that can be hazardous. The patient may be oblivious to what's happening and may not even know what he or she is doing. If the patient is confused and wandering, either gently guide him/her away and block access to outside areas - Reassure the individual and be comforting - Call 911. In most cases, the seizure ends before EMS arrives. However, there are cases when seizures may last over 3 to 5 minutes. Or the individual may have developed breathing difficulties or severe injuries. If a pregnant patient or a person with diabetes develops a seizure, it is prudent to call an ambulance. - Finally, if the patient does not regain full consciousness, then call EMS. Most patients will remain confused for about 45 to 90 minutes after a seizure, so you must use judgment in calling for  help. - Avoid restraints but make sure the patient is in a bed with padded side rails - Place the individual in a lateral position with the neck slightly flexed; this will help the saliva drain from the mouth and prevent the tongue from falling backward - Remove all nearby furniture and other hazards from the area - Provide verbal assurance as the individual is regaining consciousness - Provide the patient with privacy if possible - Call for help and start treatment as ordered by the caregiver   After the Seizure (Postictal Stage)   After a seizure, most patients experience confusion, fatigue, muscle pain and/or a headache. Thus, one should permit the individual to sleep. For the next few days, reassurance is essential. Being calm and helping reorient the person is also of importance.   Most seizures are painless and end spontaneously. Seizures are not harmful to others but can lead to complications such as stress on the lungs, brain and the heart. Individuals with prior lung problems may develop labored breathing and respiratory distress.      It was a pleasure caring for you today in the emergency department.  Please return to the emergency department for any worsening or worrisome symptoms.

## 2022-10-29 LAB — LEVETIRACETAM LEVEL: Levetiracetam Lvl: <2 ug/mL — ABNORMAL LOW (ref 10.0–40.0)

## 2023-01-19 ENCOUNTER — Encounter (HOSPITAL_COMMUNITY): Payer: Self-pay | Admitting: *Deleted

## 2023-01-19 ENCOUNTER — Emergency Department (HOSPITAL_COMMUNITY)
Admission: EM | Admit: 2023-01-19 | Discharge: 2023-01-19 | Disposition: A | Discharge status: Home / Self Care | Payer: Medicaid Other | Attending: Emergency Medicine | Admitting: Emergency Medicine

## 2023-01-19 ENCOUNTER — Other Ambulatory Visit: Payer: Self-pay

## 2023-01-19 DIAGNOSIS — Z79899 Other long term (current) drug therapy: Secondary | ICD-10-CM | POA: Insufficient documentation

## 2023-01-19 DIAGNOSIS — I10 Essential (primary) hypertension: Secondary | ICD-10-CM | POA: Insufficient documentation

## 2023-01-19 DIAGNOSIS — R569 Unspecified convulsions: Secondary | ICD-10-CM | POA: Diagnosis present

## 2023-01-19 DIAGNOSIS — R5383 Other fatigue: Secondary | ICD-10-CM | POA: Insufficient documentation

## 2023-01-19 LAB — COMPREHENSIVE METABOLIC PANEL
ALT: 13 U/L (ref 0–44)
AST: 16 U/L (ref 15–41)
Albumin: 4 g/dL (ref 3.5–5.0)
Alkaline Phosphatase: 34 U/L — ABNORMAL LOW (ref 38–126)
Anion gap: 8 (ref 5–15)
BUN: 15 mg/dL (ref 6–20)
CO2: 20 mmol/L — ABNORMAL LOW (ref 22–32)
Calcium: 8.8 mg/dL — ABNORMAL LOW (ref 8.9–10.3)
Chloride: 107 mmol/L (ref 98–111)
Creatinine, Ser: 0.74 mg/dL (ref 0.44–1.00)
GFR, Estimated: >60 mL/min (ref >60)
Glucose, Bld: 101 mg/dL — ABNORMAL HIGH (ref 70–99)
Potassium: 4.1 mmol/L (ref 3.5–5.1)
Sodium: 135 mmol/L (ref 135–145)
Total Bilirubin: 0.5 mg/dL (ref 0.3–1.2)
Total Protein: 7.1 g/dL (ref 6.5–8.1)

## 2023-01-19 LAB — DIFFERENTIAL
Abs Immature Granulocytes: 0.01 10*3/uL (ref 0.00–0.07)
Basophils Absolute: 0 10*3/uL (ref 0.0–0.1)
Basophils Relative: 1 %
Eosinophils Absolute: 0.1 10*3/uL (ref 0.0–0.5)
Eosinophils Relative: 2 %
Immature Granulocytes: 0 %
Lymphocytes Relative: 38 %
Lymphs Abs: 2.4 10*3/uL (ref 0.7–4.0)
Monocytes Absolute: 0.6 10*3/uL (ref 0.1–1.0)
Monocytes Relative: 9 %
Neutro Abs: 3.2 10*3/uL (ref 1.7–7.7)
Neutrophils Relative %: 50 %

## 2023-01-19 LAB — CBC
HCT: 41.4 % (ref 36.0–46.0)
Hemoglobin: 13.6 g/dL (ref 12.0–15.0)
MCH: 27.4 pg (ref 26.0–34.0)
MCHC: 32.9 g/dL (ref 30.0–36.0)
MCV: 83.3 fL (ref 80.0–100.0)
Platelets: 286 10*3/uL (ref 150–400)
RBC: 4.97 MIL/uL (ref 3.87–5.11)
RDW: 14.3 % (ref 11.5–15.5)
WBC: 6.3 10*3/uL (ref 4.0–10.5)
nRBC: 0 % (ref 0.0–0.2)

## 2023-01-19 LAB — CBG MONITORING, ED: Glucose-Capillary: 99 mg/dL (ref 70–99)

## 2023-01-19 LAB — I-STAT BETA HCG BLOOD, ED (MC, WL, AP ONLY): I-stat hCG, quantitative: <5 m[IU]/mL (ref <5)

## 2023-01-19 LAB — MAGNESIUM: Magnesium: 2.2 mg/dL (ref 1.7–2.4)

## 2023-01-19 MED ORDER — LACTATED RINGERS IV BOLUS
1000.0000 mL | Freq: Once | INTRAVENOUS | Status: AC
  Administered 2023-01-19: 1000 mL via INTRAVENOUS

## 2023-01-19 MED ORDER — LEVETIRACETAM 500 MG PO TABS: 500.0000 mg | ORAL_TABLET | Freq: Two times a day (BID) | ORAL | 3 refills | Status: DC

## 2023-01-19 MED ORDER — LEVETIRACETAM IN NACL 1500 MG/100ML IV SOLN
1500.0000 mg | Freq: Once | INTRAVENOUS | Status: AC
  Administered 2023-01-19: 1500 mg via INTRAVENOUS
  Filled 2023-01-19: qty 100

## 2023-01-19 NOTE — ED Notes (Signed)
Pt said she will give [place] a urine sample after her fluids are finished

## 2023-01-19 NOTE — ED Notes (Signed)
Pt ambulatory to bathroom for urine sample.

## 2023-01-19 NOTE — ED Triage Notes (Signed)
BIB EMS due to seizure witnessed lasting 2-5 min. Has brain tumor and when she does not take meds has seizures, Has missed bed for 2 days. 130/81-71-99% RA-18 CBG 120

## 2023-01-19 NOTE — ED Notes (Signed)
Pt used the bathroom but dropped the specimen cup

## 2023-01-19 NOTE — Discharge Instructions (Addendum)
A new prescription for your Keppra was sent to your pharmacy.  Continue to take as prescribed.  Below is a telephone number for a neurosurgery follow-up.  Schedule an appointment to at least have a discussion.  Return to the emergency department for any new or worsening symptoms of concern.

## 2023-01-19 NOTE — ED Provider Notes (Signed)
[facility] EMERGENCY DEPARTMENT AT [facility] Provider Note   CSN: [id number] Arrival date & time: 01/19/23  4098     History  Chief Complaint  Patient presents with   Seizures    Shelia Rasmussen is a 37 y.o. female.   Seizures Patient presents for seizure.  Medical history includes seizures and HTN.  Home medications include Keppra.  She has had multiple ED visits in the past for seizure episodes.  Most recently, this occurred in February.  This was witnessed by family at home.  EMS was called to the scene.  When EMS arrived on scene, patient had mild postictal state.  This resolved after 10 minutes.  Patient reports that he ran out of Keppra 2 days ago.  She has a prescription that she can refill, she is just not been able to yet.  She currently endorses some mild fatigue but denies any other symptoms.     Home Medications Prior to Admission medications   Medication Sig Start Date End Date Taking? Authorizing Provider  levETIRAcetam (KEPPRA) 500 MG tablet Take 1 tablet (500 mg total) by mouth 2 (two) times daily. 01/19/23 05/19/23  Gloris Manchester, MD      Allergies    Patient has no known allergies.    Review of Systems   Review of Systems  Neurological:  Positive for seizures.  All other systems reviewed and are negative.   Physical Exam Updated Vital Signs BP (!) 132/92   Pulse (!) 57   Temp 98.4 F (36.9 C) (Oral)   Resp 13   Ht 5\' 6"  (1.676 m)   Wt 105.2 kg   SpO2 97%   BMI 37.45 kg/m  Physical Exam Vitals and nursing note reviewed.  Constitutional:      General: She is not in acute distress.    Appearance: Normal appearance. She is well-developed. She is not ill-appearing, toxic-appearing or diaphoretic.  HENT:     Head: Normocephalic and atraumatic.     Right Ear: External ear normal.     Left Ear: External ear normal.     Nose: Nose normal.     Mouth/Throat:     Mouth: Mucous membranes are moist.     Pharynx: Oropharynx is clear.      Comments: No significant tongue bite Eyes:     Extraocular Movements: Extraocular movements intact.     Conjunctiva/sclera: Conjunctivae normal.  Cardiovascular:     Rate and Rhythm: Normal rate and regular rhythm.  Pulmonary:     Effort: Pulmonary effort is normal. No respiratory distress.  Abdominal:     General: There is no distension.     Palpations: Abdomen is soft.     Tenderness: There is no abdominal tenderness.  Musculoskeletal:        General: No swelling. Normal range of motion.     Cervical back: Normal range of motion and neck supple.     Right lower leg: No edema.     Left lower leg: No edema.  Skin:    General: Skin is warm and dry.     Capillary Refill: Capillary refill takes less than 2 seconds.     Coloration: Skin is not jaundiced or pale.  Neurological:     General: No focal deficit present.     Mental Status: She is alert and oriented to person, place, and time.     Cranial Nerves: No cranial nerve deficit.     Sensory: No sensory deficit.  Motor: No weakness.     Coordination: Coordination normal.  Psychiatric:        Mood and Affect: Mood normal.        Behavior: Behavior normal.        Thought Content: Thought content normal.        Judgment: Judgment normal.     ED Results / Procedures / Treatments   Labs (all labs ordered are listed, but only abnormal results are displayed) Labs Reviewed  COMPREHENSIVE METABOLIC PANEL - Abnormal; Notable for the following components:      Result Value   CO2 20 (*)    Glucose, Bld 101 (*)    Calcium 8.8 (*)    Alkaline Phosphatase 34 (*)    All other components within normal limits  CBC  DIFFERENTIAL  MAGNESIUM  CBC WITH DIFFERENTIAL/PLATELET  URINALYSIS, ROUTINE W REFLEX MICROSCOPIC  RAPID URINE DRUG SCREEN, HOSP PERFORMED  LEVETIRACETAM LEVEL  CBG MONITORING, ED  I-STAT BETA HCG BLOOD, ED ([facility], [facility], [facility] ONLY)    EKG EKG Interpretation  Date/Time:  Sunday January 19 2023 09:57:26 EDT Ventricular  Rate:  68 PR Interval:  160 QRS Duration: 98 QT Interval:  392 QTC Calculation: 417 R Axis:   86 Text Interpretation: Sinus rhythm Confirmed by Gloris Manchester ([id number]) on 01/19/2023 10:43:29 AM  Radiology No results found.  Procedures Procedures    Medications Ordered in ED Medications  levETIRAcetam (KEPPRA) IVPB 1500 mg/ 100 mL premix (0 mg Intravenous Stopped 01/19/23 1201)  lactated ringers bolus 1,000 mL (1,000 mLs Intravenous New Bag/Given 01/19/23 1047)    ED Course/ Medical Decision Making/ A&P                             Medical Decision Making Amount and/or Complexity of Data Reviewed Labs: ordered.  Risk Prescription drug management.   This patient presents to the ED for concern of seizure, this involves an extensive number of treatment options, and is a complaint that carries with it a high risk of complications and morbidity.  The differential diagnosis includes medication nonadherence, worsening brain mass, alcohol withdrawal, head trauma, infection   Co morbidities that complicate the patient evaluation  Seizures, brain mass   Additional history obtained:  Additional history obtained from EMS External records from outside source obtained and reviewed including EMR   Lab Tests:  I Ordered, and personally interpreted labs.  The pertinent results include: Normal hemoglobin, no leukocytosis, normal kidney function, normal electrolytes  Cardiac Monitoring: / EKG:  The patient was maintained on a cardiac monitor.  I personally viewed and interpreted the cardiac monitored which showed an underlying rhythm of: Sinus rhythm   Problem List / ED Course / Critical interventions / Medication management  Patient presents for seizure episode.  This is in the setting of medication nonadherence over the past 2 days.  She has been diagnosed with a brain tumor which she has been reluctant to follow-up with neurosurgery about.  She is on 500 mg of Keppra twice daily.  She  is typically adherent to this medication but did run out 2 days ago.  Seizure episode today was witnessed by her significant other.  Episode had resolved by the time of EMS arrival.  On arrival in the ED, patient is alert and oriented.  She has no focal neurologic deficits.  She endorses some mild fatigue but denies any other symptoms.  Patient was placed on bedside cardiac monitor.  IV  fluids and loading dose of Keppra were ordered.  Laboratory workup was initiated.  Lab results are unremarkable.  Patient remained asymptomatic while in the ED.  Fatigue resolved.  I spoke with the patient regarding her prior diagnosis of a brain mass and reluctance to follow-up with neurosurgery.  Patient was encouraged to schedule an outpatient follow-up with neurosurgery to at least have a discussion about ongoing surveillance and possible interventions.  Contact information was provided.  Patient's Keppra prescription was updated.  She was discharged in stable condition. I ordered medication including IV fluids for hydration; Keppra for seizure prophylaxis Reevaluation of the patient after these medicines showed that the patient resolved I have reviewed the patients home medicines and have made adjustments as needed   Social Determinants of Health:  Does not have PCP        Final Clinical Impression(s) / ED Diagnoses Final diagnoses:  Seizure ([facility])    Rx / DC Orders ED Discharge Orders          Ordered    levETIRAcetam (KEPPRA) 500 MG tablet  2 times daily        01/19/23 1240              Gloris Manchester, MD 01/19/23 1242

## 2023-01-22 LAB — LEVETIRACETAM LEVEL: Levetiracetam Lvl: <2 ug/mL — ABNORMAL LOW (ref 10.0–40.0)

## 2023-11-15 ENCOUNTER — Ambulatory Visit
Admission: EM | Admit: 2023-11-15 | Discharge: 2023-11-15 | Disposition: A | Discharge status: Home / Self Care | Payer: Medicaid Other | Attending: Family Medicine | Admitting: Family Medicine

## 2023-11-15 DIAGNOSIS — Z760 Encounter for issue of repeat prescription: Secondary | ICD-10-CM

## 2023-11-15 DIAGNOSIS — R569 Unspecified convulsions: Secondary | ICD-10-CM | POA: Diagnosis not present

## 2023-11-15 HISTORY — DX: Neoplasm of unspecified behavior of brain: D49.6

## 2023-11-15 MED ORDER — LEVETIRACETAM 500 MG PO TABS
500.0000 mg | ORAL_TABLET | Freq: Two times a day (BID) | ORAL | 0 refills | Status: DC
Start: 2023-11-15 — End: 2024-01-16

## 2023-11-15 NOTE — ED Provider Notes (Signed)
 [facility]    CSN: [id number] Arrival date & time: 11/15/23  1519      History   Chief Complaint No chief complaint on file.   HPI Shelia Rasmussen is a 38 y.o. female presents for medication refill.  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  Currently taking Keppra twice daily.  States her last seizure was April 2024.  States her last dose of her Keppra was yesterday as she has run out.  She has not established with a PCP or her neurologist that has been assigned to her by [facility] for unclear reasons.  States she has been getting her refills mainly through the ER or through urgent cares.  No other concerns at this time.  HPI  Past Medical History:  Diagnosis Date   Brain tumor [facility])    Hypertension    Seizure (HCC)     There are no active problems to display for this patient.   History reviewed. No pertinent surgical history.  OB History   No obstetric history on file.      Home Medications    Prior to Admission medications   Medication Sig Start Date End Date Taking? Authorizing Provider  levETIRAcetam (KEPPRA) 500 MG tablet Take 1 tablet (500 mg total) by mouth 2 (two) times daily. 11/15/23 12/15/23 Yes Radford Pax, NP  levETIRAcetam (KEPPRA) 500 MG tablet Take 1 tablet (500 mg total) by mouth 2 (two) times daily. 01/19/23 05/19/23  Gloris Manchester, MD    Family History History reviewed. No pertinent family history.  Social History Social History   Tobacco Use   Smoking status: Every Day    Current packs/day: 0.50    Types: Cigarettes   Smokeless tobacco: Never  Substance Use Topics   Alcohol use: No   Drug use: No     Allergies   Patient has no known allergies.   Review of Systems Review of Systems  Neurological:        Seizure medication refill request     Physical Exam Triage Vital Signs ED Triage Vitals  Encounter Vitals Group     BP 11/15/23 1528 (!) 145/89     Systolic BP Percentile --      Diastolic BP  Percentile --      Pulse Rate 11/15/23 1528 63     Resp 11/15/23 1528 16     Temp 11/15/23 1528 98.9 F (37.2 C)     Temp Source 11/15/23 1528 Oral     SpO2 11/15/23 1528 97 %     Weight --      Height --      Head Circumference --      Peak Flow --      Pain Score 11/15/23 1527 0     Pain Loc --      Pain Education --      Exclude from Growth Chart --    No data found.  Updated Vital Signs BP (!) 145/89 (BP Location: Right Arm)   Pulse 63   Temp 98.9 F (37.2 C) (Oral)   Resp 16   SpO2 97%   Visual Acuity Right Eye Distance:   Left Eye Distance:   Bilateral Distance:    Right Eye Near:   Left Eye Near:    Bilateral Near:     Physical Exam Vitals and nursing note reviewed.  Constitutional:      General: She is not in acute distress.    Appearance:  Normal appearance. She is not ill-appearing.  HENT:     Head: Normocephalic and atraumatic.  Eyes:     Pupils: Pupils are equal, round, and reactive to light.  Cardiovascular:     Rate and Rhythm: Normal rate.  Pulmonary:     Effort: Pulmonary effort is normal.  Skin:    General: Skin is warm and dry.  Neurological:     General: No focal deficit present.     Mental Status: She is alert and oriented to person, place, and time.     GCS: GCS eye subscore is 4. GCS verbal subscore is 5. GCS motor subscore is 6.     Motor: No seizure activity.  Psychiatric:        Mood and Affect: Mood normal.        Behavior: Behavior normal.      UC Treatments / Results  Labs (all labs ordered are listed, but only abnormal results are displayed) Labs Reviewed - No data to display  Comprehensive metabolic panel Order: [id number]  Status: Final result     Next appt: None   Test Result Released: No (inaccessible in MyChart)   0 Result Notes          Component Ref Range & Units (hover) 10 mo ago (01/19/23) 1 yr ago (10/28/22) 1 yr ago (08/12/22) 1 yr ago (05/13/22) 2 yr ago (04/19/21) 2 yr ago (01/05/21) 6 yr ago (09/24/17)   Sodium 135 138 138 135 138 135 137  Potassium 4.1 3.9 4.2 3.9 4.1 3.9 4.1  Chloride 107 109 108 106 105 106 108 R  CO2 20 Low  19 Low  22 21 Low   21 Low  23  Glucose, Bld 101 High  100 High  CM 103 High  CM 125 High  CM 104 High  CM 115 High  CM 99 R  Comment: Glucose reference range applies only to samples taken after fasting for at least 8 hours.  BUN 15 8 10 12 12 11 10   Creatinine, Ser 0.74 0.63 0.69 0.86 0.60 0.82 0.73  Calcium 8.8 Low  8.5 Low  8.7 Low  8.5 Low   8.7 Low  9.0  Total Protein 7.1 6.9 6.1 Low    6.6 7.3  Albumin 4.0 3.7 3.5   3.8 3.9  AST 16 17 14  Low    23 18  ALT 13 13 9   17 12  Low  R  Alkaline Phosphatase 34 Low  35 Low  36 Low    33 Low  32 Low   Total Bilirubin 0.5 0.9 0.4   0.9 0.7  GFR, Estimated >60 >60 CM >60 CM >60 CM  >60 CM   Comment: (NOTE) Calculated using the CKD-EPI Creatinine Equation (2021)  Anion gap 8 10 CM 8 CM 8 CM  8 CM 6  Comment: Performed at [facility], [address], [place], [place] [address]  Resulting Agency [facility]             View All Conversations on this Encounter       EKG   Radiology No results found.  Procedures Procedures (including critical care time)  Medications Ordered in UC Medications - No data to display  Initial Impression / Assessment and Plan / UC Course  I have reviewed the triage vital signs and the nursing notes.  Pertinent labs &  imaging results that were available during my care of the patient were reviewed by me and considered in my medical decision making (see chart for details).     Patient reports last dose of Keppra yesterday, no seizure since April 2024 per patient.  Will do 30-day refill of Keppra and strongly encouraged her to establish with a PCP and neurology for monitoring of her condition as well as for additional refills.  Patient verbalized understanding and will contact though she  has been assigned to next week to set up these appointments.  She was instructed to go to ER for any seizure activity or concerning symptoms. Final Clinical Impressions(s) / UC Diagnoses   Final diagnoses:  Encounter for medication refill  Seizures (HCC)     Discharge Instructions      I refilled your Keppra for 1 month.  Please establish with a PCP or your neurologist that you have been assigned to for monitoring of your seizures as well as additional refills of your medication.  Please go to the ER if you do develop any seizures or any concerning symptoms.    ED Prescriptions     Medication Sig Dispense Auth. Provider   levETIRAcetam (KEPPRA) 500 MG tablet Take 1 tablet (500 mg total) by mouth 2 (two) times daily. 60 tablet Radford Pax, NP      PDMP not reviewed this encounter.   Radford Pax, NP 11/15/23 [phone]

## 2023-11-15 NOTE — Discharge Instructions (Addendum)
 I refilled your Keppra for 1 month.  Please establish with a PCP or your neurologist that you have been assigned to for monitoring of your seizures as well as additional refills of your medication.  Please go to the ER if you do develop any seizures or any concerning symptoms.

## 2023-11-15 NOTE — ED Triage Notes (Signed)
 Pt presents to UC for refill of levetiracetam prescription. Pt states she normally gets her meds filled by going to the emergency department. Does not have a PCP because she states "they want me to have surgery and I can't have that right now." Last dose of medication was 2 days ago.

## 2024-01-16 ENCOUNTER — Encounter (HOSPITAL_COMMUNITY): Payer: Self-pay

## 2024-01-16 ENCOUNTER — Emergency Department (HOSPITAL_COMMUNITY)

## 2024-01-16 ENCOUNTER — Other Ambulatory Visit: Payer: Self-pay

## 2024-01-16 ENCOUNTER — Emergency Department (HOSPITAL_COMMUNITY)
Admission: EM | Admit: 2024-01-16 | Discharge: 2024-01-16 | Disposition: A | Discharge status: Home / Self Care | Attending: Emergency Medicine | Admitting: Emergency Medicine

## 2024-01-16 DIAGNOSIS — I10 Essential (primary) hypertension: Secondary | ICD-10-CM | POA: Insufficient documentation

## 2024-01-16 DIAGNOSIS — R569 Unspecified convulsions: Secondary | ICD-10-CM | POA: Diagnosis present

## 2024-01-16 DIAGNOSIS — Z760 Encounter for issue of repeat prescription: Secondary | ICD-10-CM

## 2024-01-16 LAB — CBC WITH DIFFERENTIAL/PLATELET
Abs Immature Granulocytes: 0.03 10*3/uL (ref 0.00–0.07)
Basophils Absolute: 0 10*3/uL (ref 0.0–0.1)
Basophils Relative: 1 %
Eosinophils Absolute: 0.1 10*3/uL (ref 0.0–0.5)
Eosinophils Relative: 1 %
HCT: 45.1 % (ref 36.0–46.0)
Hemoglobin: 14.5 g/dL (ref 12.0–15.0)
Immature Granulocytes: 0 %
Lymphocytes Relative: 23 %
Lymphs Abs: 1.8 10*3/uL (ref 0.7–4.0)
MCH: 28 pg (ref 26.0–34.0)
MCHC: 32.2 g/dL (ref 30.0–36.0)
MCV: 87.1 fL (ref 80.0–100.0)
Monocytes Absolute: 0.5 10*3/uL (ref 0.1–1.0)
Monocytes Relative: 7 %
Neutro Abs: 5.2 10*3/uL (ref 1.7–7.7)
Neutrophils Relative %: 68 %
Platelets: 248 10*3/uL (ref 150–400)
RBC: 5.18 MIL/uL — ABNORMAL HIGH (ref 3.87–5.11)
RDW: 14.1 % (ref 11.5–15.5)
WBC: 7.6 10*3/uL (ref 4.0–10.5)
nRBC: 0 % (ref 0.0–0.2)

## 2024-01-16 LAB — COMPREHENSIVE METABOLIC PANEL WITH GFR
ALT: 18 U/L (ref 0–44)
AST: 23 U/L (ref 15–41)
Albumin: 4.5 g/dL (ref 3.5–5.0)
Alkaline Phosphatase: 36 U/L — ABNORMAL LOW (ref 38–126)
Anion gap: 11 (ref 5–15)
BUN: 11 mg/dL (ref 6–20)
CO2: 23 mmol/L (ref 22–32)
Calcium: 9.2 mg/dL (ref 8.9–10.3)
Chloride: 104 mmol/L (ref 98–111)
Creatinine, Ser: 0.72 mg/dL (ref 0.44–1.00)
GFR, Estimated: >60 mL/min (ref >60)
Glucose, Bld: 98 mg/dL (ref 70–99)
Potassium: 3.7 mmol/L (ref 3.5–5.1)
Sodium: 138 mmol/L (ref 135–145)
Total Bilirubin: 0.4 mg/dL (ref 0.0–1.2)
Total Protein: 7.6 g/dL (ref 6.5–8.1)

## 2024-01-16 LAB — HCG, SERUM, QUALITATIVE: Preg, Serum: NEGATIVE

## 2024-01-16 LAB — MAGNESIUM: Magnesium: 2.4 mg/dL (ref 1.7–2.4)

## 2024-01-16 MED ORDER — LEVETIRACETAM 500 MG PO TABS: 500.0000 mg | ORAL_TABLET | Freq: Two times a day (BID) | ORAL | 0 refills | Status: DC

## 2024-01-16 MED ORDER — LACTATED RINGERS IV BOLUS
1000.0000 mL | Freq: Once | INTRAVENOUS | Status: AC
  Administered 2024-01-16: 1000 mL via INTRAVENOUS

## 2024-01-16 MED ORDER — LEVETIRACETAM IN NACL 1500 MG/100ML IV SOLN
1500.0000 mg | Freq: Once | INTRAVENOUS | Status: AC
  Administered 2024-01-16: 1500 mg via INTRAVENOUS
  Filled 2024-01-16: qty 100

## 2024-01-16 NOTE — ED Triage Notes (Signed)
 Pt BIBA from work, c/o seizure witnessed, hit her head on the metal box. Postictal. Started developing a hematoma on the back of her head. VSS.   CBG 161

## 2024-01-16 NOTE — ED Provider Notes (Signed)
 [place] EMERGENCY DEPARTMENT AT [facility] Provider Note   CSN: [id number] Arrival date & time: 01/16/24  1519     History  Chief Complaint  Patient presents with   Seizures    Shelia Rasmussen is a 38 y.o. female.  38 year old female presents today for concern of seizure-like activity that occurred while patient was at work.  She states she slipped and fell on a wet floor while at work and hit her head on a metal box.  She was found to be having seizure-like activity which was witnessed by her coworkers.  EMS was activated.  She states that she has been out of her home medicines for the past 2 days. She does have a known brain mass but she states that she has been unable to follow-up with neurosurgery in the outpatient setting.  She states she cannot afford to mince any time off work to be seen in the clinic nor can she miss any time from work postsurgery for recovery and she also does not have anyone that can look after her.  This thought that make her tearful.  The history is provided by the patient and the EMS personnel. No language interpreter was used.       Home Medications Prior to Admission medications   Medication Sig Start Date End Date Taking? Authorizing Provider  levETIRAcetam  (KEPPRA ) 500 MG tablet Take 1 tablet (500 mg total) by mouth 2 (two) times daily. 01/19/23 05/19/23  Iva Mariner, MD  levETIRAcetam  (KEPPRA ) 500 MG tablet Take 1 tablet (500 mg total) by mouth 2 (two) times daily. 11/15/23 12/15/23  Mayer, Jodi R, NP      Allergies    Patient has no known allergies.    Review of Systems   Review of Systems  Respiratory:  Negative for shortness of breath.   Cardiovascular:  Negative for chest pain.  Neurological:  Positive for seizures and headaches.  All other systems reviewed and are negative.   Physical Exam Updated Vital Signs BP (!) 138/97 (BP Location: Left Arm)   Pulse 67   Temp 97.9 F (36.6 C) (Oral)   Resp 17   Ht 5\' 6"  (1.676  m)   Wt 88.5 kg   SpO2 98%   BMI 31.47 kg/m  Physical Exam Vitals and nursing note reviewed.  Constitutional:      General: She is not in acute distress.    Appearance: Normal appearance. She is not ill-appearing.  HENT:     Head: Normocephalic and atraumatic.     Nose: Nose normal.  Eyes:     Conjunctiva/sclera: Conjunctivae normal.  Cardiovascular:     Rate and Rhythm: Normal rate and regular rhythm.  Pulmonary:     Effort: Pulmonary effort is normal. No respiratory distress.  Musculoskeletal:        General: No deformity. Normal range of motion.     Cervical back: Normal range of motion.  Skin:    Findings: No rash.  Neurological:     General: No focal deficit present.     Mental Status: She is alert and oriented to person, place, and time. Mental status is at baseline.     Cranial Nerves: No cranial nerve deficit.     Motor: No weakness.     ED Results / Procedures / Treatments   Labs (all labs ordered are listed, but only abnormal results are displayed) Labs Reviewed  CBC WITH DIFFERENTIAL/PLATELET  COMPREHENSIVE METABOLIC PANEL WITH GFR  MAGNESIUM  LEVETIRACETAM   LEVEL  HCG, SERUM, QUALITATIVE    EKG None  Radiology CT Head Wo Contrast Result Date: 01/16/2024 CLINICAL DATA:  Witnessed seizure.  Hit head on metal box. EXAM: CT HEAD WITHOUT CONTRAST TECHNIQUE: Contiguous axial images were obtained from the base of the skull through the vertex without intravenous contrast. RADIATION DOSE REDUCTION: This exam was performed according to the departmental dose-optimization program which includes automated exposure control, adjustment of the mA and/or kV according to patient size and/or use of iterative reconstruction technique. COMPARISON:  CT head without contrast 10/28/2022. MR head without and with contrast 05/13/2022. FINDINGS: Brain: The medial right temporal lobe hyperdense mass lesion with inferior calcifications is similar to the prior studies. No significant  surrounding vasogenic edema is present. No acute infarct hemorrhage or other mass lesion is present. Deep brain nuclei are within normal limits. No significant white matter lesions are present. The ventricles are of normal size. No significant extraaxial fluid collection is present. The brainstem and cerebellum are within normal limits. Midline structures are within normal limits. Vascular: No hyperdense vessel or unexpected calcification. Skull: Left parietal scalp hematoma is present without underlying fracture. No foreign body is present. No other significant extracranial soft tissue lesion is present. Calvarium is intact. No focal lytic or blastic lesions are present. Sinuses/Orbits: The paranasal sinuses and mastoid air cells are clear. The globes and orbits are within normal limits. IMPRESSION: 1. Left parietal scalp hematoma without underlying fracture or foreign body. 2. Stable medial right temporal lobe hyperdense mass lesion with inferior calcifications. 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 3. No acute intracranial abnormality or significant interval change. Electronically Signed   By: Audree Leas M.D.   On: 01/16/2024 16:56    Procedures Procedures    Medications Ordered in ED Medications  lactated ringers  bolus 1,000 mL (has no administration in time range)  levETIRAcetam  (KEPPRA ) IVPB 1500 mg/ 100 mL premix (has no administration in time range)    ED Course/ Medical Decision Making/ A&P                                 Medical Decision Making Amount and/or Complexity of Data Reviewed Labs: ordered. Radiology: ordered.  Risk Prescription drug management.   Medical Decision Making / ED Course   This patient presents to the ED for concern of seizure, this involves an extensive number of treatment options, and is a complaint that carries with it a high risk of complications and morbidity.  The differential diagnosis includes head injury, seizure,  medication noncompliance, malignancy  MDM: 38 year old female presents today for concern of seizure-like activity.  Endorses missing her doses for the past couple days.  Requires refills. Does endorse head injury according to bystanders. Episode similar to generalized seizure from description that EMS was given. Will obtain labs, CT head and provide Keppra  load.  CT head shows stable mass which is concerning for neoplasm.  Stressed importance of follow-up with neurosurgery.  She states she is convinced at this point that she will follow-up given that this occurred during waking hours and specially that it occurred while she was at work.  Will give her neurosurgery follow-up. Remainder of the workup reassuring. Refills prescribed. Discharged in stable condition.   Additional history obtained: -Additional history obtained from previous chart review -External records from outside source obtained and reviewed including: Chart review including previous notes, labs, imaging, consultation notes   Lab Tests: -I ordered,  reviewed, and interpreted labs.   The pertinent results include:   Labs Reviewed  CBC WITH DIFFERENTIAL/PLATELET - Abnormal; Notable for the following components:      Result Value   RBC 5.18 (*)    All other components within normal limits  COMPREHENSIVE METABOLIC PANEL WITH GFR - Abnormal; Notable for the following components:   Alkaline Phosphatase 36 (*)    All other components within normal limits  MAGNESIUM  HCG, SERUM, QUALITATIVE  LEVETIRACETAM  LEVEL      EKG  EKG Interpretation Date/Time:  Friday January 16 2024 15:35:57 EDT Ventricular Rate:  68 PR Interval:  157 QRS Duration:  100 QT Interval:  405 QTC Calculation: 431 R Axis:   83  Text Interpretation: Sinus rhythm Confirmed by Onetha Bile [phone]) on 01/16/2024 6:56:18 PM         Imaging Studies ordered: I ordered imaging studies including CT head I independently visualized and  interpreted imaging. I agree with the radiologist interpretation   Medicines ordered and prescription drug management: Meds ordered this encounter  Medications   lactated ringers  bolus 1,000 mL   levETIRAcetam  (KEPPRA ) IVPB 1500 mg/ 100 mL premix    -I have reviewed the patients home medicines and have made adjustments as needed    Reevaluation: After the interventions noted above, I reevaluated the patient and found that they have :stayed the same  Co morbidities that complicate the patient evaluation  Past Medical History:  Diagnosis Date   Brain tumor (HCC)    Hypertension    Seizure (HCC)       Dispostion: Discharged in stable condition.  Return precaution discussed.  Patient voices understanding and is in agreement with the plan.   Final Clinical Impression(s) / ED Diagnoses Final diagnoses:  Seizure-like activity [facility])    Rx / DC Orders ED Discharge Orders          Ordered    levETIRAcetam  (KEPPRA ) 500 MG tablet  2 times daily        01/16/24 1943              Lucina Sabal, PA-C 01/16/24 1944    Onetha Bile, MD 01/19/24 [phone]

## 2024-01-16 NOTE — Discharge Instructions (Signed)
 Please follow-up with neurosurgery.  Please Julio Ohm with your primary care provider.  CT head does show a mass concerning for neoplasm.

## 2024-01-17 LAB — LEVETIRACETAM LEVEL: Levetiracetam Lvl: <2 ug/mL — ABNORMAL LOW (ref 10.0–40.0)

## 2024-01-20 ENCOUNTER — Telehealth: Payer: Self-pay | Admitting: General Practice

## 2024-01-20 NOTE — Telephone Encounter (Signed)
 This is not a [facility] pt who has been on our service for a F/u appt. We are currently not accepting new patients.  Copied from CRM [id number]. Topic: Appointments - Scheduling Inquiry for Clinic >> Jan 20, 2024 12:26 PM Karole Pacer C wrote: Reason for CRM: Patient looking to est care and schedule a hospital follow up appointment. Patient's call back # is [phone].

## 2024-03-20 ENCOUNTER — Other Ambulatory Visit: Payer: Self-pay

## 2024-03-20 ENCOUNTER — Emergency Department (HOSPITAL_COMMUNITY)
Admission: EM | Admit: 2024-03-20 | Discharge: 2024-03-20 | Disposition: A | Discharge status: Home / Self Care | Attending: Emergency Medicine | Admitting: Emergency Medicine

## 2024-03-20 ENCOUNTER — Encounter (HOSPITAL_COMMUNITY): Payer: Self-pay

## 2024-03-20 DIAGNOSIS — R569 Unspecified convulsions: Secondary | ICD-10-CM | POA: Insufficient documentation

## 2024-03-20 DIAGNOSIS — Z760 Encounter for issue of repeat prescription: Secondary | ICD-10-CM | POA: Insufficient documentation

## 2024-03-20 DIAGNOSIS — I10 Essential (primary) hypertension: Secondary | ICD-10-CM | POA: Diagnosis not present

## 2024-03-20 MED ORDER — LEVETIRACETAM 500 MG PO TABS: 500.0000 mg | ORAL_TABLET | Freq: Two times a day (BID) | ORAL | 0 refills | Status: DC

## 2024-03-20 NOTE — Discharge Instructions (Addendum)
 I have sent your Keppra  prescription to your pharmacy. I have also sent a referral to a new primary care doctor to help you get established with someone for continued management of your symptoms and medications.  They should be calling in the next several days to schedule an appointment.

## 2024-03-20 NOTE — ED Triage Notes (Signed)
 Pt arrives via POV. PT reports she ran out of her Keppra  prescription last night. PT states she is here in hopes to get a refill. Pt AxOx4. She denies any complaints.

## 2024-03-20 NOTE — ED Provider Notes (Signed)
 [place] EMERGENCY DEPARTMENT AT [facility] Provider Note   CSN: [id number] Arrival date & time: 03/20/24  0740     Patient presents with: Medication Refill   Shelia Rasmussen is a 38 y.o. female with a history of brain tumor, seizures, and hypertension who presents the ED today for medication refill.  Patient reports that she took her last tablet of Keppra  this morning and is requesting a refill of her medication.  She states that she has been trying to make appointments with her primary care doctor but has not been able to get in for a while now.  States that they tell her to keep coming back to schedule an appointment, which she says is difficult because of her work schedule.  She denies any recent seizures.  No additional complaints or concerns at this time.    Prior to Admission medications   Medication Sig Start Date End Date Taking? Authorizing Provider  levETIRAcetam  (KEPPRA ) 500 MG tablet Take 1 tablet (500 mg total) by mouth 2 (two) times daily. 01/16/24 02/15/24  Hildegard Loge, PA-C  levETIRAcetam  (KEPPRA ) 500 MG tablet Take 1 tablet (500 mg total) by mouth 2 (two) times daily. 03/20/24 06/18/24  Waddell Sluder, PA-C    Allergies: Patient has no known allergies.    Review of Systems  Constitutional:        Medication refill    Updated Vital Signs BP (!) 145/94 (BP Location: Right Arm)   Pulse 83   Temp 98.3 F (36.8 C) (Oral)   Resp 16   SpO2 100%   Physical Exam Vitals and nursing note reviewed.  Constitutional:      General: She is not in acute distress.    Appearance: Normal appearance.  HENT:     Head: Normocephalic and atraumatic.     Mouth/Throat:     Mouth: Mucous membranes are moist.   Eyes:     Conjunctiva/sclera: Conjunctivae normal.     Pupils: Pupils are equal, round, and reactive to light.    Cardiovascular:     Rate and Rhythm: Normal rate and regular rhythm.     Pulses: Normal pulses.     Heart sounds: Normal heart sounds.   Pulmonary:     Effort: Pulmonary effort is normal.     Breath sounds: Normal breath sounds.   Musculoskeletal:        General: Normal range of motion.     Cervical back: Normal range of motion.   Skin:    General: Skin is warm and dry.     Findings: No rash.   Neurological:     General: No focal deficit present.     Mental Status: She is alert.   Psychiatric:        Mood and Affect: Mood normal.        Behavior: Behavior normal.    (all labs ordered are listed, but only abnormal results are displayed) Labs Reviewed - No data to display  EKG: None  Radiology: No results found.   Procedures   Medications Ordered in the ED - No data to display                                  Medical Decision Making Risk Prescription drug management.   This patient presents to the ED for concern of medication refill, this involves an extensive number of treatment options, and is a complaint that carries  with it a high risk of complications and morbidity.     Comorbidities  See HPI above   Additional History  Additional history obtained from prior records   Problem List / ED Course / Critical Interventions / Medication Management  Patient is here today for medication refill.  She states that she has been unable to make appointments with her primary care doctor but ran out of her seizure medication this morning. In her records, there is a note from 4/29 from an internal medicine office and that they are not accepting patients after patient tried to make an appointment for a hospital follow up. I have sent an ambulatory referral for primary care for patient to see, since she is unable to get in to the one that was recommended to her. Prescription for Keppra  was sent to her pharmacy.   Social Determinants of Health  Access to healthcare   Test / Admission - Considered  Patient stable and safe for discharge home. Return precautions provided.    Final diagnoses:   Medication refill    ED Discharge Orders          Ordered    levETIRAcetam  (KEPPRA ) 500 MG tablet  2 times daily        03/20/24 0756    Ambulatory referral to [facility]        03/20/24 0800               Waddell Sluder, PA-C 03/20/24 9193    Lenor Hollering, MD 03/20/24 [phone]

## 2024-09-14 ENCOUNTER — Emergency Department (HOSPITAL_COMMUNITY): Admission: EM | Admit: 2024-09-14 | Discharge: 2024-09-14 | Disposition: A | Discharge status: Home / Self Care

## 2024-09-14 DIAGNOSIS — Z760 Encounter for issue of repeat prescription: Secondary | ICD-10-CM | POA: Diagnosis present

## 2024-09-14 DIAGNOSIS — R569 Unspecified convulsions: Secondary | ICD-10-CM

## 2024-09-14 MED ORDER — LEVETIRACETAM 500 MG PO TABS: 500.0000 mg | ORAL_TABLET | Freq: Two times a day (BID) | ORAL | 0 refills | Status: AC

## 2024-09-14 NOTE — ED Triage Notes (Signed)
 Pt states she is here for keppra  refill. Does not have PCP and only has two days left of her medication. Takes 500 mg BID.

## 2024-09-14 NOTE — Discharge Instructions (Addendum)
 Thank you for visiting the Emergency Department today. It was a pleasure to be part of your healthcare team.  As discussed, your Keppra  has been refilled.  Please continue to take medication as directed.  If you have any questions about your medicines, please call your pharmacy or healthcare provider. Attached, is a primary care and neurologist-please follow-up for further evaluation and care. Thank you for trusting us  with your health.

## 2024-09-14 NOTE — ED Provider Notes (Signed)
 " [place] [facility] AT [facility] Provider Note   CSN: [id number] Arrival date & time: 09/14/24  1001     Patient presents with: Medication Refill   Shelia Rasmussen is a 38 y.o. female patient here requesting medication refill only.  Patient states that she has been unable to get in with primary care and neurology for further evaluation and medication management.  Patient states that she has not missed any doses of her Keppra  and has not experienced any seizures.  Patient is in no acute distress.   Medication Refill     Prior to Admission medications  Medication Sig Start Date End Date Taking? Authorizing Provider  levETIRAcetam  (KEPPRA ) 500 MG tablet Take 1 tablet (500 mg total) by mouth 2 (two) times daily. 09/14/24 10/14/24  Omolola Mittman L, PA    Allergies: Patient has no known allergies.    Review of Systems  Reason unable to perform ROS: no reported symptoms.    Updated Vital Signs BP 134/88 (BP Location: Left Arm)   Pulse 100   Temp 97.9 F (36.6 C) (Oral)   Resp 16   SpO2 100%   Physical Exam Vitals and nursing note reviewed.  Constitutional:      General: She is not in acute distress.    Appearance: Normal appearance.  HENT:     Head: Normocephalic and atraumatic.  Eyes:     Extraocular Movements: Extraocular movements intact.     Conjunctiva/sclera: Conjunctivae normal.     Pupils: Pupils are equal, round, and reactive to light.  Cardiovascular:     Rate and Rhythm: Normal rate.     Pulses: Normal pulses.  Pulmonary:     Effort: Pulmonary effort is normal. No respiratory distress.  Musculoskeletal:        General: Normal range of motion.     Cervical back: Normal range of motion.  Neurological:     General: No focal deficit present.     Mental Status: She is alert. Mental status is at baseline.  Psychiatric:        Mood and Affect: Mood normal.     (all labs ordered are listed, but only abnormal results are  displayed) Labs Reviewed - No data to display  EKG: None  Radiology: No results found.   Procedures   Medications Ordered in the ED - No data to display                                Medical Decision Making  Patient presents to the ED for: Medication refill  Clinical Course as of 09/17/24 0250  Tue Sep 14, 2024  1141 Temp: 98.1 F (36.7 C) Afebrile, vital stable, patient in no acute distress [ML]    Clinical Course User Index [ML] Willma Bouchard L, PA    Management / Treatments: I have reviewed the patients home medicines and have made adjustments as needed  ED Course / Reassessments: Problem List: Medication refill 38 year old female presented for medication refill of her Keppra . Initial assessment included history, physical exam, and review of prior medical records.  Patient's Keppra  was refilled and patient was given a referral for primary care and neurology for further medication management.   Social determinants impacting care: limited/at-risk for follow-up  Disposition: Disposition: Discharge with close follow-up with PCP and neurology for further evaluation and care of seizures and medication management. Rationale for disposition: Stable for discharge The disposition plan  and rationale were discussed with the patient at the bedside, all questions were addressed, and the patient demonstrated understanding.  This note was produced using [employer]. While I have reviewed and verified all clinical information, transcription errors may remain.      Final diagnoses:  Medication refill    ED Discharge Orders          Ordered    levETIRAcetam  (KEPPRA ) 500 MG tablet  2 times daily        09/14/24 1148               Willma Duwaine CROME, [place] 09/17/24 0252    Neysa Caron PARAS, DO 09/17/24 [id number]  "
